# Patient Record
Sex: Female | Born: 1985 | Race: Black or African American | Hispanic: Refuse to answer | Marital: Single | Smoking: Never smoker
Health system: Southern US, Community
[De-identification: ages and names within clinical notes are randomized; demographics above are authoritative.]

## PROBLEM LIST (undated history)

## (undated) DIAGNOSIS — R251 Tremor, unspecified: Secondary | ICD-10-CM

## (undated) DIAGNOSIS — U071 COVID-19: Secondary | ICD-10-CM

## (undated) HISTORY — DX: Tremor, unspecified: R25.1

---

## 2005-05-21 HISTORY — PX: KNEE ARTHROSCOPY W/ ACL RECONSTRUCTION: SHX1858

## 2015-09-12 ENCOUNTER — Encounter: Payer: Self-pay | Admitting: Family Medicine

## 2015-09-12 ENCOUNTER — Ambulatory Visit (INDEPENDENT_AMBULATORY_CARE_PROVIDER_SITE_OTHER): Payer: BLUE CROSS/BLUE SHIELD | Admitting: Family Medicine

## 2015-09-12 VITALS — BP 107/56 | HR 86 | Temp 97.7°F | Resp 14

## 2015-09-12 DIAGNOSIS — J029 Acute pharyngitis, unspecified: Secondary | ICD-10-CM

## 2015-09-12 MED ORDER — AZITHROMYCIN 250 MG PO TABS: ORAL_TABLET | ORAL | Status: DC

## 2015-09-12 NOTE — Addendum Note (Signed)
Addended by: Madlyn Frankel on: 09/12/2015 06:24 PM   Modules accepted: Orders

## 2015-09-12 NOTE — Progress Notes (Signed)
Patient ID: Jennifer Hanna, female   DOB: 02-12-1986, 29 y.o.   MRN: [id number]  Patient presents today with symptoms of sore throat for about 2 weeks. Patient denies any other symptoms. She is taken ibuprofen once for her symptoms. She denies any postnasal drip but does state that the sore throat is worse in the evening. She denies any fever, headache, nausea, vomiting, diarrhea, abdominal pain. She denies any extreme fatigue or weight loss.  ROS: Negative except mentioned above. Vitals as per Epic.  GENERAL: NAD HEENT: mild pharyngeal erythema, no exudate, no erythema of TMs, no cervical LAD RESP: CTA B CARD: RRR NEURO: CN II-XII grossly intact   A/P: Pharyngitis- Rapid Strep test was negative. Given the length of symptoms Will try Z-Pak, ibuprofen when necessary, Claritin when necessary. If any symptoms persist or worsen she will follow up with me or her primary care physician.

## 2017-01-10 ENCOUNTER — Other Ambulatory Visit: Payer: Self-pay | Admitting: Family Medicine

## 2017-01-10 ENCOUNTER — Ambulatory Visit
Admission: RE | Admit: 2017-01-10 | Discharge: 2017-01-10 | Disposition: A | Discharge status: Home / Self Care | Payer: BLUE CROSS/BLUE SHIELD | Source: Ambulatory Visit | Attending: Family Medicine | Admitting: Family Medicine

## 2017-01-10 ENCOUNTER — Ambulatory Visit (INDEPENDENT_AMBULATORY_CARE_PROVIDER_SITE_OTHER): Payer: BLUE CROSS/BLUE SHIELD | Admitting: Family Medicine

## 2017-01-10 DIAGNOSIS — R079 Chest pain, unspecified: Secondary | ICD-10-CM | POA: Diagnosis not present

## 2017-01-10 DIAGNOSIS — R0789 Other chest pain: Secondary | ICD-10-CM | POA: Diagnosis not present

## 2017-01-10 DIAGNOSIS — M940 Chondrocostal junction syndrome [Tietze]: Secondary | ICD-10-CM | POA: Diagnosis not present

## 2017-01-17 NOTE — Progress Notes (Signed)
Patient presents today with substernal chest discomfort. Patient states that she's had this discomfort for the past month. She states that it happens mostly when she is lying down. She denies any reflux symptoms however does eat late at night before going to bed. She denies any shortness of breath or headache or diaphoresis associated. She denies any alcohol or illicit drug use. She denies any history of smoking. She denies any significant past medical history such as PE. Denies any family history of sudden cardiac death. She does admit that she had a upper respiratory infection a few weeks ago in which she had a pretty bad cough. She denies any fevers or chills or weight loss. Exercise does not make her symptoms worse.  ROS: Negative except mentioned above. Vitals as per Epic. GENERAL: NAD HEENT: no pharyngeal erythema, no exudate, no erythema of TMs, no cervical LAD RESP: CTA B, mild tenderness to palpation along left sternal border CARD: RRR NEURO: CN II-XII grossly intact   A/P: Costochondritis - discussed with patient that this is likely what her symptoms are from however will get chest x-ray today, EKG appears normal with PACs, encourage patient to try taking ibuprofen or Aleve with food, encourage patient to avoid eating late at night before going to bed. Would like patient to follow up with me in one week to see how her symptoms are doing. Seek medical attention if any acute problems. Patient addresses understanding of plan.

## 2017-05-01 ENCOUNTER — Encounter: Payer: Self-pay | Admitting: Obstetrics and Gynecology

## 2017-05-03 ENCOUNTER — Ambulatory Visit: Payer: BLUE CROSS/BLUE SHIELD | Admitting: Certified Nurse Midwife

## 2017-05-03 ENCOUNTER — Encounter: Payer: Self-pay | Admitting: Certified Nurse Midwife

## 2017-05-03 VITALS — BP 89/55 | HR 58 | Ht 68.0 in | Wt 145.5 lb

## 2017-05-03 DIAGNOSIS — Z130 Encounter for screening for diseases of the blood and blood-forming organs and certain disorders involving the immune mechanism: Secondary | ICD-10-CM

## 2017-05-03 DIAGNOSIS — Z124 Encounter for screening for malignant neoplasm of cervix: Secondary | ICD-10-CM | POA: Diagnosis not present

## 2017-05-03 DIAGNOSIS — Z13220 Encounter for screening for lipoid disorders: Secondary | ICD-10-CM

## 2017-05-03 DIAGNOSIS — Z01419 Encounter for gynecological examination (general) (routine) without abnormal findings: Secondary | ICD-10-CM

## 2017-05-03 DIAGNOSIS — Z6822 Body mass index (BMI) 22.0-22.9, adult: Secondary | ICD-10-CM | POA: Diagnosis not present

## 2017-05-03 NOTE — Progress Notes (Signed)
ANNUAL PREVENTATIVE CARE GYN  ENCOUNTER NOTE  Subjective:       Barbera Perritt is a 31 y.o. G0P0000 female here for a routine annual gynecologic exam. No current complaints.   Denies difficulty breathing or respiratory distress, chest pain, abdominal pain, excessive vaginal bleeding, dysuria, and leg pain or swelling.     Gynecologic History  Patient's last menstrual period was 04/12/2017.  Period Cycle (Days): 28 Period Duration (Days): Seven (7) Period Pattern: Regular Menstrual Flow: Light Dysmenorrhea: None  Contraception: none  Last Pap: 2016. Results were: normal  Obstetric History  OB History  Gravida Para Term Preterm AB Living  0 0 0 0 0 0  SAB TAB Ectopic Multiple Live Births  0 0 0 0 0        History reviewed. No pertinent past medical history.  Past Surgical History:  Procedure Laterality Date  . KNEE ARTHROSCOPY W/ ACL RECONSTRUCTION Left 2007    Current Outpatient Medications on File Prior to Visit  Medication Sig Dispense Refill  . azithromycin (ZITHROMAX Z-PAK) 250 MG tablet Use as directed for 5 days. (Patient not taking: Reported on 05/03/2017) 6 each 0   No current facility-administered medications on file prior to visit.     No Known Allergies  Social History   Socioeconomic History  . Marital status: Single    Spouse name: Not on file  . Number of children: Not on file  . Years of education: Not on file  . Highest education level: Not on file  Social Needs  . Financial resource strain: Not on file  . Food insecurity - worry: Not on file  . Food insecurity - inability: Not on file  . Transportation needs - medical: Not on file  . Transportation needs - non-medical: Not on file  Occupational History  . Not on file  Tobacco Use  . Smoking status: Never Smoker  . Smokeless tobacco: Never Used  Substance and Sexual Activity  . Alcohol use: Yes  . Drug use: No  . Sexual activity: Yes  Other Topics Concern  . Not on file  Social  History Narrative  . Not on file    Family History  Problem Relation Age of Onset  . Hypertension Father     The following portions of the patient's history were reviewed and updated as appropriate: allergies, current medications, past family history, past medical history, past social history, past surgical history and problem list.  Review of Systems  ROS negative except as noted above. Information obtained from patient.    Objective:   BP (!) 89/55   Pulse (!) 58   Ht 5\' 8"  (1.727 m)   Wt 145 lb 8 oz (66 kg)   LMP 04/12/2017   BMI 22.12 kg/m    CONSTITUTIONAL: Well-developed, well-nourished female in no acute distress.   PSYCHIATRIC: Normal mood and affect. Normal behavior. Normal judgment and thought content.  [place]: Alert and oriented to person, place, and time. Normal muscle tone coordination. No cranial nerve deficit noted.  HENT:  Normocephalic, atraumatic, External right and left ear normal. Oropharynx is clear and moist  EYES: Conjunctivae and EOM are normal. Pupils are equal, round, and reactive to light. No scleral icterus.   NECK: Normal range of motion, supple, no masses.  Normal thyroid.   SKIN: Skin is warm and dry. No rash noted. Not diaphoretic. No erythema. No pallor.  CARDIOVASCULAR: Normal heart rate noted, regular rhythm, no murmur.  RESPIRATORY: Clear to auscultation bilaterally. Effort and breath  sounds normal, no problems with respiration noted.  BREASTS: Symmetric in size. No masses, skin changes, nipple drainage, or lymphadenopathy.  ABDOMEN: Soft, normal bowel sounds, no distention noted.  No tenderness, rebound or guarding.   PELVIC:  External Genitalia: Normal  Vagina: Normal  Cervix: Normal  Uterus: Normal  Adnexa: Normal   MUSCULOSKELETAL: Normal range of motion. No tenderness.  No cyanosis, clubbing, or edema.  2+ distal pulses.  LYMPHATIC: No Axillary, Supraclavicular, or Inguinal Adenopathy.  Assessment:   Annual  gynecologic examination 31 y.o.   Contraception: none   Normal BMI   Problem List Items Addressed This Visit    None    Visit Diagnoses    Well woman exam    -  Primary   Relevant Orders   CBC   Comprehensive metabolic panel   Lipid panel   PapIG, HPV, rfx 16/18   BMI 22.0-22.9, adult       Screening for lipid disorders       Screening, anemia, deficiency, iron       Relevant Orders   CBC   Lipid panel   Screening for cervical cancer       Relevant Orders   PapIG, HPV, rfx 16/18      Plan:   Pap: Pap Co Test  Labs: CBC, CMP, Lipid Panel; see orders.   Routine preventative health maintenance measures emphasized: Exercise/Diet/Weight control, Tobacco Warnings, Alcohol/Substance use risks, Stress Management, Peer Pressure Issues and Safe Sex  Reviewed red flag symptoms and when to call.   RTC x 1 year for annual exam or sooner if needed.    Diona Fanti, CNM [facility], [facility]

## 2017-05-03 NOTE — Patient Instructions (Signed)
Preventive Care 18-39 Years, Female Preventive care refers to lifestyle choices and visits with your health care provider that can promote health and wellness. What does preventive care include?  A yearly physical exam. This is also called an annual well check.  Dental exams once or twice a year.  Routine eye exams. Ask your health care provider how often you should have your eyes checked.  Personal lifestyle choices, including: ? Daily care of your teeth and gums. ? Regular physical activity. ? Eating a healthy diet. ? Avoiding tobacco and drug use. ? Limiting alcohol use. ? Practicing safe sex. ? Taking vitamin and mineral supplements as recommended by your health care provider. What happens during an annual well check? The services and screenings done by your health care provider during your annual well check will depend on your age, overall health, lifestyle risk factors, and family history of disease. Counseling Your health care provider may ask you questions about your:  Alcohol use.  Tobacco use.  Drug use.  Emotional well-being.  Home and relationship well-being.  Sexual activity.  Eating habits.  Work and work Statistician.  Method of birth control.  Menstrual cycle.  Pregnancy history.  Screening You may have the following tests or measurements:  Height, weight, and BMI.  Diabetes screening. This is done by checking your blood sugar (glucose) after you have not eaten for a while (fasting).  Blood pressure.  Lipid and cholesterol levels. These may be checked every 5 years starting at age 66.  Skin check.  Hepatitis C blood test.  Hepatitis B blood test.  Sexually transmitted disease (STD) testing.  BRCA-related cancer screening. This may be done if you have a family history of breast, ovarian, tubal, or peritoneal cancers.  Pelvic exam and Pap test. This may be done every 3 years starting at age 40. Starting at age 59, this may be done every 5  years if you have a Pap test in combination with an HPV test.  Discuss your test results, treatment options, and if necessary, the need for more tests with your health care provider. Vaccines Your health care provider may recommend certain vaccines, such as:  Influenza vaccine. This is recommended every year.  Tetanus, diphtheria, and acellular pertussis (Tdap, Td) vaccine. You may need a Td booster every 10 years.  Varicella vaccine. You may need this if you have not been vaccinated.  HPV vaccine. If you are 69 or younger, you may need three doses over 6 months.  Measles, mumps, and rubella (MMR) vaccine. You may need at least one dose of MMR. You may also need a second dose.  Pneumococcal 13-valent conjugate (PCV13) vaccine. You may need this if you have certain conditions and were not previously vaccinated.  Pneumococcal polysaccharide (PPSV23) vaccine. You may need one or two doses if you smoke cigarettes or if you have certain conditions.  Meningococcal vaccine. One dose is recommended if you are age 27-21 years and a first-year college student living in a residence hall, or if you have one of several medical conditions. You may also need additional booster doses.  Hepatitis A vaccine. You may need this if you have certain conditions or if you travel or work in places where you may be exposed to hepatitis A.  Hepatitis B vaccine. You may need this if you have certain conditions or if you travel or work in places where you may be exposed to hepatitis B.  Haemophilus influenzae type b (Hib) vaccine. You may need this if  you have certain risk factors.  Talk to your health care provider about which screenings and vaccines you need and how often you need them. This information is not intended to replace advice given to you by your health care provider. Make sure you discuss any questions you have with your health care provider. Document Released: 07/03/2001 Document Revised: 01/25/2016  Document Reviewed: 03/08/2015 Elsevier Interactive Patient Education  2017 [employer].

## 2017-05-04 LAB — CBC
Hematocrit: 41.6 % (ref 34.0–46.6)
Hemoglobin: 13.9 g/dL (ref 11.1–15.9)
MCH: 31.7 pg (ref 26.6–33.0)
MCHC: 33.4 g/dL (ref 31.5–35.7)
MCV: 95 fL (ref 79–97)
Platelets: 240 10*3/uL (ref 150–379)
RBC: 4.39 x10E6/uL (ref 3.77–5.28)
RDW: 13.6 % (ref 12.3–15.4)
WBC: 3.4 10*3/uL (ref 3.4–10.8)

## 2017-05-04 LAB — COMPREHENSIVE METABOLIC PANEL
A/G RATIO: 1.5 (ref 1.2–2.2)
ALK PHOS: 44 IU/L (ref 39–117)
ALT: 9 IU/L (ref 0–32)
AST: 18 IU/L (ref 0–40)
Albumin: 4.6 g/dL (ref 3.5–5.5)
BILIRUBIN TOTAL: 0.5 mg/dL (ref 0.0–1.2)
BUN / CREAT RATIO: 14 (ref 9–23)
BUN: 14 mg/dL (ref 6–20)
CHLORIDE: 102 mmol/L (ref 96–106)
CO2: 25 mmol/L (ref 20–29)
Calcium: 9.3 mg/dL (ref 8.7–10.2)
Creatinine, Ser: 1.03 mg/dL — ABNORMAL HIGH (ref 0.57–1.00)
GFR calc Af Amer: 83 mL/min/{1.73_m2} (ref >59)
GFR calc non Af Amer: 72 mL/min/{1.73_m2} (ref >59)
GLOBULIN, TOTAL: 3.1 g/dL (ref 1.5–4.5)
GLUCOSE: 85 mg/dL (ref 65–99)
POTASSIUM: 4.9 mmol/L (ref 3.5–5.2)
SODIUM: 139 mmol/L (ref 134–144)
Total Protein: 7.7 g/dL (ref 6.0–8.5)

## 2017-05-04 LAB — LIPID PANEL
CHOLESTEROL TOTAL: 190 mg/dL (ref 100–199)
Chol/HDL Ratio: 2.9 ratio (ref 0.0–4.4)
HDL: 65 mg/dL (ref >39)
LDL Calculated: 117 mg/dL — ABNORMAL HIGH (ref 0–99)
Triglycerides: 41 mg/dL (ref 0–149)
VLDL Cholesterol Cal: 8 mg/dL (ref 5–40)

## 2017-05-05 LAB — PAPIG, HPV, RFX 16/18
HPV, high-risk: NEGATIVE
PAP Smear Comment: 0

## 2017-05-07 NOTE — Progress Notes (Signed)
Please contact pt with results. Pap normal. CBC normal. Her serum creatinine was slightly elevated, but this could be due to dehydration, high meat intake, or use of supplements. We can recheck again in six (6) months to a year. LDL was slightly elevated as well, but risk is well below average. Encourage MyChart activation. Thanks, JML

## 2017-08-29 ENCOUNTER — Encounter: Payer: Self-pay | Admitting: Family Medicine

## 2017-08-29 ENCOUNTER — Ambulatory Visit (INDEPENDENT_AMBULATORY_CARE_PROVIDER_SITE_OTHER): Payer: BLUE CROSS/BLUE SHIELD | Admitting: Family Medicine

## 2017-08-29 VITALS — BP 104/72 | HR 88 | Temp 100.2°F | Resp 14

## 2017-08-29 DIAGNOSIS — R509 Fever, unspecified: Secondary | ICD-10-CM

## 2017-08-29 DIAGNOSIS — R05 Cough: Secondary | ICD-10-CM

## 2017-08-29 DIAGNOSIS — J029 Acute pharyngitis, unspecified: Secondary | ICD-10-CM

## 2017-08-29 DIAGNOSIS — R059 Cough, unspecified: Secondary | ICD-10-CM

## 2017-08-29 LAB — POCT RAPID STREP A (OFFICE): RAPID STREP A SCREEN: NEGATIVE

## 2017-08-29 LAB — POCT INFLUENZA A/B
INFLUENZA A, POC: NEGATIVE
INFLUENZA B, POC: NEGATIVE

## 2017-08-29 MED ORDER — OSELTAMIVIR PHOSPHATE 75 MG PO CAPS: 75.0000 mg | ORAL_CAPSULE | Freq: Two times a day (BID) | ORAL | 0 refills | Status: DC

## 2017-08-29 NOTE — Progress Notes (Signed)
Patient presents today with symptoms of myalgias, chills, sore throat, nasal drainage, subjective fever, minimal cough. Patient symptoms started yesterday. She denies any chest pain, shortness of breath, nausea, vomiting, diarrhea, urinary symptoms, abdominal pain. Her menstrual cycle just recently ended.She is unsure of any sick contacts. She did not get her flu vaccine this year.  ROS: Negative except mentioned above. Vitals as per Epic. GENERAL: NAD HEENT: moderate pharyngeal erythema, no exudate, no erythema of TMs, shotty cervical LAD RESP: CTA B CARD: RRR NEURO: CN II-XII grossly intact   A/P: Viral Illness - rapid strep test negative, influenza test negative, symptoms however suggestive of influenza, discussed use of Tamiflu patient would like to start, treat with Tylenol/Ibuprofen as needed,cough suppressant as needed, oral antihistamine as needed, rest, hydration, no physical activity until fever has resolved for at least 24 hours, will send throat culture to lab, seek medical attention if symptoms persist or worsen.

## 2017-08-31 LAB — CULTURE, GROUP A STREP: Strep A Culture: NEGATIVE

## 2017-09-03 ENCOUNTER — Ambulatory Visit (INDEPENDENT_AMBULATORY_CARE_PROVIDER_SITE_OTHER): Payer: BLUE CROSS/BLUE SHIELD | Admitting: Family Medicine

## 2017-09-03 ENCOUNTER — Ambulatory Visit
Admission: RE | Admit: 2017-09-03 | Discharge: 2017-09-03 | Disposition: A | Discharge status: Home / Self Care | Payer: BLUE CROSS/BLUE SHIELD | Source: Ambulatory Visit | Attending: Family Medicine | Admitting: Family Medicine

## 2017-09-03 ENCOUNTER — Encounter: Payer: Self-pay | Admitting: Family Medicine

## 2017-09-03 VITALS — BP 113/78 | HR 64 | Temp 97.7°F | Resp 14

## 2017-09-03 DIAGNOSIS — R1084 Generalized abdominal pain: Secondary | ICD-10-CM | POA: Diagnosis not present

## 2017-09-03 DIAGNOSIS — R197 Diarrhea, unspecified: Secondary | ICD-10-CM

## 2017-09-03 LAB — POCT URINE PREGNANCY: Preg Test, Ur: NEGATIVE

## 2017-09-03 MED ORDER — DICYCLOMINE HCL 20 MG PO TABS: 20.0000 mg | ORAL_TABLET | Freq: Four times a day (QID) | ORAL | 0 refills | Status: DC

## 2017-09-03 NOTE — Progress Notes (Signed)
Patient presents today with symptoms of abdominal cramping, vomiting, diarrhea. Patient states that her symptoms started on 4/16. Patient was seen last week for flu-like symptoms and was prescribed Tamiflu. Patient states that she took 4 doses of the Tamiflu. She felt like she was improving. Her URI symptoms are much improved. She denies any fever, severe headache, myalgias, chest pain, shortness of breath, urinary symptoms. She denies any irregular menstrual cycles recently. She has been tolerating fluids.she feels at times that she has gas but cannot expel it. She denies any blood in her stool or black tarry stool. Denies nausea at this time.  ROS: Negative except mentioned above.  GENERAL: NAD HEENT: no pharyngeal erythema, no exudate, no erythema of TMs, no cervical LAD RESP: CTA B CARD: RRR ABD: positive bowel sounds, generalized abdominal tenderness, no rebound or guarding, no flank tenderness NEURO: CN II-XII grossly intact   Urine dip - trace leukocytes, negative blood, negative nitrites, specific gravity 1.025, 1+ protein, 15 ketones, pH 5.0, negative glucose  Urine pregnancy - negative  A/P: Generalized Abdominal Pain, Vomiting/Diarrhea - discussed with patient that could be a result of the virus or the Tamiflu, will do Abdominal X-ray and prescribe Bentyl as anti-spasmodic, rest, hydration, BRAT diet, Tylenol when necessary, seek medical attention if symptoms persist or worsen as discussed.

## 2017-09-04 DIAGNOSIS — L814 Other melanin hyperpigmentation: Secondary | ICD-10-CM | POA: Diagnosis not present

## 2017-09-04 DIAGNOSIS — B360 Pityriasis versicolor: Secondary | ICD-10-CM | POA: Diagnosis not present

## 2017-09-04 DIAGNOSIS — L812 Freckles: Secondary | ICD-10-CM | POA: Diagnosis not present

## 2017-09-04 DIAGNOSIS — L700 Acne vulgaris: Secondary | ICD-10-CM | POA: Diagnosis not present

## 2018-01-06 ENCOUNTER — Ambulatory Visit: Payer: BLUE CROSS/BLUE SHIELD | Admitting: Certified Nurse Midwife

## 2018-01-06 ENCOUNTER — Encounter: Payer: Self-pay | Admitting: Certified Nurse Midwife

## 2018-01-06 VITALS — BP 119/71 | HR 77 | Ht 68.0 in | Wt 145.4 lb

## 2018-01-06 DIAGNOSIS — Z803 Family history of malignant neoplasm of breast: Secondary | ICD-10-CM | POA: Diagnosis not present

## 2018-01-06 DIAGNOSIS — N644 Mastodynia: Secondary | ICD-10-CM

## 2018-01-06 DIAGNOSIS — N6320 Unspecified lump in the left breast, unspecified quadrant: Secondary | ICD-10-CM

## 2018-01-06 DIAGNOSIS — N6325 Unspecified lump in the left breast, overlapping quadrants: Secondary | ICD-10-CM

## 2018-01-06 MED ORDER — EVENING PRIMROSE OIL-VIT E 45-360-15 MG-MG-UNIT PO CAPS: 1.0000 | ORAL_CAPSULE | Freq: Three times a day (TID) | ORAL | 0 refills | Status: DC

## 2018-01-06 NOTE — Progress Notes (Signed)
Pt stated that she has a lump on her left breast that is painful. Pt stated that she noticed the lump 2-3 months ago.  No other complaints.

## 2018-01-06 NOTE — Progress Notes (Signed)
GYN ENCOUNTER NOTE  Subjective:       Jennifer Hanna is a 32 y.o. G0P0000 female is here for gynecologic evaluation of the following issues:  1. Left breast lump 2. Breast pain  Reports intermittent left breast pain and tenderness accompanied by lump. Noticed approximately two (2) to three (3) months ago. Drinks water. Endorses low caffeine and salt intake. Pain increased around menses.   Denies difficulty breathing or respiratory distress, chest pain, nipple pain or discharge, abdominal pain, excessive vaginal bleeding, dysuria, and leg pain or swelling.   Mother was recently diagnosed with breast cancer, but is doing well.    Gynecologic History  Patient's last menstrual period was 12/13/2017.  Contraception: none  Last Pap: 04/2017. Results were: Negative/Negative  Obstetric History  OB History  Gravida Para Term Preterm AB Living  0 0 0 0 0 0  SAB TAB Ectopic Multiple Live Births  0 0 0 0 0    History reviewed. No pertinent past medical history.  Past Surgical History:  Procedure Laterality Date  . KNEE ARTHROSCOPY W/ ACL RECONSTRUCTION Left 2007   No Known Allergies  Social History   Socioeconomic History  . Marital status: Single    Spouse name: Not on file  . Number of children: Not on file  . Years of education: Not on file  . Highest education level: Not on file  Occupational History  . Not on file  Social Needs  . Financial resource strain: Not on file  . Food insecurity:    Worry: Not on file    Inability: Not on file  . Transportation needs:    Medical: Not on file    Non-medical: Not on file  Tobacco Use  . Smoking status: Never Smoker  . Smokeless tobacco: Never Used  Substance and Sexual Activity  . Alcohol use: Yes    Comment: occass/social  . Drug use: No  . Sexual activity: Yes    Birth control/protection: None, Condom  Lifestyle  . Physical activity:    Days per week: Not on file    Minutes per session: Not on file  . Stress: Not  on file  Relationships  . Social connections:    Talks on phone: Not on file    Gets together: Not on file    Attends religious service: Not on file    Active member of club or organization: Not on file    Attends meetings of clubs or organizations: Not on file    Relationship status: Not on file  . Intimate partner violence:    Fear of current or ex partner: Not on file    Emotionally abused: Not on file    Physically abused: Not on file    Forced sexual activity: Not on file  Other Topics Concern  . Not on file  Social History Narrative  . Not on file    Family History  Problem Relation Age of Onset  . Hypertension Father   . Cancer Mother     The following portions of the patient's history were reviewed and updated as appropriate: allergies, current medications, past family history, past medical history, past social history, past surgical history and problem list.  Review of Systems  ROS negative except as noted above. Information obtained from patient.   Objective:   BP 119/71   Pulse 77   Ht 5\' 8"  (1.727 m)   Wt 145 lb 6.4 oz (66 kg)   LMP 12/13/2017   BMI 22.11 kg/m  CONSTITUTIONAL: Well-developed, well-nourished female in no acute distress.   BREASTS: Right breast normal without mass, skin or nipple changes or axillary nodes. Left breast tender to touch with pea sized mobile round mass at 6 o'clock, no skin or nipple changes.   Assessment:   1. Family history of breast cancer in mother  - MM DIGITAL SCREENING BILATERAL; Future  2. Breast pain in female   3. Breast lump on left side at 6 o'clock position  - MM DIGITAL SCREENING BILATERAL; Future  Plan:   Discussed home treatment measures.   Rx: Evening Primrose Oil, see orders.   Mammogram ordered.   Reviewed red flag symptoms and when to call.   RTC as previously scheduled or sooner if needed.    Diona Fanti, CNM [facility], [facility]

## 2018-01-06 NOTE — Patient Instructions (Signed)

## 2018-01-07 ENCOUNTER — Telehealth: Payer: Self-pay | Admitting: Certified Nurse Midwife

## 2018-01-07 ENCOUNTER — Other Ambulatory Visit: Payer: Self-pay | Admitting: Certified Nurse Midwife

## 2018-01-07 ENCOUNTER — Telehealth: Payer: Self-pay

## 2018-01-07 DIAGNOSIS — Z803 Family history of malignant neoplasm of breast: Secondary | ICD-10-CM

## 2018-01-07 DIAGNOSIS — N6325 Unspecified lump in the left breast, overlapping quadrants: Secondary | ICD-10-CM

## 2018-01-07 DIAGNOSIS — N6320 Unspecified lump in the left breast, unspecified quadrant: Secondary | ICD-10-CM

## 2018-01-07 NOTE — Telephone Encounter (Signed)
The patient called and wanted to know if her diagnosis was put in so she is able to schedule her mammogram. Please advise.

## 2018-01-07 NOTE — Telephone Encounter (Signed)
Message left on voicemail- yes diagnosis is attached to order. Encouraged to let us know if there are any other questions or concerns.

## 2018-01-08 ENCOUNTER — Telehealth: Payer: Self-pay | Admitting: Certified Nurse Midwife

## 2018-01-08 NOTE — Telephone Encounter (Signed)
The patient called again in regard to her needing orders put in to schedule her mammogram, and also some paperwork. Please advise.

## 2018-01-09 ENCOUNTER — Telehealth: Payer: Self-pay

## 2018-01-09 NOTE — Telephone Encounter (Signed)
Order is in. Please check to make sure is correct. Thanks, JML

## 2018-01-09 NOTE — Telephone Encounter (Signed)
Message left on pts voicemail- orders have been placed. Not sure what the issue is. Encouraged pt to contact the office if there is anything else we can help with.

## 2018-01-14 ENCOUNTER — Ambulatory Visit
Admission: RE | Admit: 2018-01-14 | Discharge: 2018-01-14 | Disposition: A | Discharge status: Home / Self Care | Payer: BLUE CROSS/BLUE SHIELD | Source: Ambulatory Visit | Attending: Certified Nurse Midwife | Admitting: Certified Nurse Midwife

## 2018-01-14 DIAGNOSIS — Z803 Family history of malignant neoplasm of breast: Secondary | ICD-10-CM | POA: Insufficient documentation

## 2018-01-14 DIAGNOSIS — N6325 Unspecified lump in the left breast, overlapping quadrants: Secondary | ICD-10-CM

## 2018-01-14 DIAGNOSIS — N6320 Unspecified lump in the left breast, unspecified quadrant: Secondary | ICD-10-CM

## 2018-01-14 DIAGNOSIS — N6489 Other specified disorders of breast: Secondary | ICD-10-CM | POA: Diagnosis not present

## 2018-01-14 DIAGNOSIS — R922 Inconclusive mammogram: Secondary | ICD-10-CM | POA: Diagnosis not present

## 2018-04-01 ENCOUNTER — Encounter: Payer: Self-pay | Admitting: Family Medicine

## 2018-04-01 ENCOUNTER — Ambulatory Visit (INDEPENDENT_AMBULATORY_CARE_PROVIDER_SITE_OTHER): Payer: BLUE CROSS/BLUE SHIELD | Admitting: Family Medicine

## 2018-04-01 VITALS — BP 112/94 | HR 63 | Temp 98.2°F | Resp 14

## 2018-04-01 DIAGNOSIS — R251 Tremor, unspecified: Secondary | ICD-10-CM | POA: Diagnosis not present

## 2018-04-01 DIAGNOSIS — R229 Localized swelling, mass and lump, unspecified: Secondary | ICD-10-CM | POA: Diagnosis not present

## 2018-04-01 DIAGNOSIS — R222 Localized swelling, mass and lump, trunk: Secondary | ICD-10-CM

## 2018-04-01 NOTE — Progress Notes (Signed)
Patient presents today with symptoms of bilateral hand numbness and pain at times.  She also has noticed a tremor with both hands over the last year.  She has not seen a physician regarding this.  She denies any fever, headache, joint swelling or discoloration, chest pain, shortness of breath, nausea, vomiting, weight loss, weight gain.  He denies taking any new medications. Patient also has had a palpable tender lesion on the left lower breast/chest wall area for awhile.  She states that it has become more tender due to her bra strap.  She has had an ultrasound and mammogram of the area.  She denies any other breast symptoms.  There is a family history of breast cancer.  ROS: Negative except mentioned above. Vitals as per Epic. GENERAL: NAD HEENT: no pharyngeal erythema, no exudate RESP: CTA B, small mobile pea-sized tender mass noted in the left lower breast/chest wall area CARD: RRR MSK: No obvious joint swelling or erythema, full range of motion of upper and lower extremities, N/V intact NEURO: CN II-XII grossly intact   A/P: 1) Left Lower Breast/Chest Wall Mass: will refer patient to general surgeon as area is palpable, not identified on ultrasound and patient wants to have area removed.  2) Numbness Hands, Tremors: recommend further evaluation by neurology, referral has been made, seek medical attention if any acute worsening symptoms.

## 2018-04-03 ENCOUNTER — Ambulatory Visit (INDEPENDENT_AMBULATORY_CARE_PROVIDER_SITE_OTHER): Payer: BLUE CROSS/BLUE SHIELD | Admitting: General Surgery

## 2018-04-03 ENCOUNTER — Encounter: Payer: Self-pay | Admitting: General Surgery

## 2018-04-03 ENCOUNTER — Other Ambulatory Visit: Payer: Self-pay

## 2018-04-03 VITALS — BP 104/72 | HR 61 | Temp 98.2°F | Ht 68.0 in | Wt 142.0 lb

## 2018-04-03 DIAGNOSIS — R0789 Other chest pain: Secondary | ICD-10-CM

## 2018-04-03 NOTE — Patient Instructions (Addendum)
Please call our office if you have questions or concerns.   

## 2018-04-03 NOTE — Progress Notes (Signed)
Patient ID: Jennifer Hanna, female   DOB: May 23, 1985, 32 y.o.   MRN: [id number]  Chief Complaint  Patient presents with  . New Patient (Initial Visit)    left breast mass    HPI Jennifer Hanna is a 32 y.o. female.   She has been referred for further evaluation of what is described as a left breast mass. She states that it has been present for about a year and is tender to deep palpation. It can be more tender around the time of her menstrual cycle. Sometimes it swells up and is more obvious. There has never been any drainage at the site.  She denies nipple drainage or skin changes. Menarche at age 20. Prior OCP use, but none currently. No pregnancies/breast feeding. She reports performing monthly self breast exams.  She was seen by her OB-GYN provider who describes a pea-sized lump at the 6 o'clock position on the left breast in her note. The patient states that she doesn't think the mass is actually in her breast, but it does rub and become irritated by her bra.  She had both a mammogram and breast ultrasound, neither of which identified any mass in the area.  She does have a family history of breast cancer, with her mother diagnosed at age 33.   History reviewed. No pertinent past medical history.  Past Surgical History:  Procedure Laterality Date  . KNEE ARTHROSCOPY W/ ACL RECONSTRUCTION Left 2007    Family History  Problem Relation Age of Onset  . Hypertension Father   . Cancer Mother   . Breast cancer Mother 23    Social History Social History   Tobacco Use  . Smoking status: Never Smoker  . Smokeless tobacco: Never Used  Substance Use Topics  . Alcohol use: Yes    Comment: occass/social  . Drug use: No    No Known Allergies  Current Outpatient Medications  Medication Sig Dispense Refill  . Evening Primrose Oil-Vit E 45-360-15 MG-MG-UNIT CAPS Take 1 capsule by mouth 3 (three) times daily. 120 each 0   No current facility-administered medications for this visit.      Review of Systems Review of Systems  Constitutional: Negative.   HENT: Negative.   Eyes: Negative.   Respiratory: Negative.   Cardiovascular: Negative.   Gastrointestinal: Negative.   Endocrine: Negative.   Genitourinary: Negative.   Musculoskeletal: Negative.   Skin: Negative.   Neurological: Negative.   Hematological: Negative.   Psychiatric/Behavioral: Negative.     Blood pressure 104/72, pulse 61, temperature 98.2 F (36.8 C), temperature source Temporal, height 5\' 8"  (1.727 m), weight 142 lb (64.4 kg), last menstrual period 03/06/2018, SpO2 99 %.  Physical Exam Physical Exam  Constitutional: She is oriented to person, place, and time. She appears well-developed and well-nourished. No distress.  HENT:  Head: Normocephalic and atraumatic.  Mouth/Throat: Oropharynx is clear and moist. No oropharyngeal exudate.  Eyes: Pupils are equal, round, and reactive to light. Conjunctivae are normal. Right eye exhibits no discharge. Left eye exhibits no discharge. No scleral icterus.  Neck: Normal range of motion. Neck supple. No tracheal deviation present. No thyromegaly present.  No supraclavicular or axillary LAD.  Cardiovascular: Normal rate, regular rhythm, normal heart sounds and intact distal pulses.  Pulmonary/Chest: Effort normal and breath sounds normal. She exhibits tenderness.  The patient directs me to the area of concern. No mass is visualized or palpated.  She is quite thin and I feel only rib/chonral cartilage in the area.  Abdominal: Soft. Bowel sounds are normal.  Navel is pierced.  Musculoskeletal: She exhibits no edema, tenderness or deformity.  Lymphadenopathy:    She has no cervical adenopathy.  Neurological: She is alert and oriented to person, place, and time.  Skin: Skin is warm and dry.  Psychiatric: She has a normal mood and affect.  Vitals reviewed.   Data Reviewed CLINICAL DATA:  32 year old patient presents for evaluation of a lump in the  lower inner quadrant of the left breast. She states that the lump is tender and is more prominent in size near her menstrual cycle. Her mother was diagnosed with breast cancer at age 35.  EXAM: DIGITAL DIAGNOSTIC BILATERAL MAMMOGRAM WITH CAD AND TOMO  ULTRASOUND LEFT BREAST  COMPARISON:  None  ACR Breast Density Category d: The breast tissue is extremely dense, which lowers the sensitivity of mammography.  FINDINGS: Metallic skin marker was placed in the region of palpable concern in the lower inner quadrant of the left breast. On a spot tangential view of this region, dense breast parenchyma is seen without a mass.  No suspicious findings are identified in either breast to suggest malignancy.  Mammographic images were processed with CAD.  On physical exam, the patient demonstrates to me the area of concern at approximately 6 o'clock position along the inferior margin of the breast and slightly inferior to the breast. I palpate a rib in the region of patient concern. I do not palpate a discrete breast mass.  Targeted ultrasound is performed of the inferior left breast. In the region of patient concern is a rib, with overlying muscle, a thin band of breast parenchyma and skin. No breast mass, fluid collection, or skin lesion is seen in the region of patient concern. Additional ultrasound of the adjacent tissues of the inferior left breast is also negative.  IMPRESSION: No evidence of malignancy in either breast. Question if the patient's pain could be musculoskeletal in nature. No suspicious findings in the inferior left breast.  RECOMMENDATION: Screening mammogram at age 18 unless there are persistent or intervening clinical concerns. (Code:SM-B-40A)  I have discussed the findings and recommendations with the patient. Results were also provided in writing at the conclusion of the visit. If applicable, a reminder letter will be sent to the patient regarding  the next appointment.  BI-RADS CATEGORY  1: Negative.   Electronically Signed   By: Curlene Dolphin M.D.   On: 01/14/2018 15:52  Assessment    No breast mass is appreciated in the area of concern.  My physical exam findings correlate with the radiologist's impression of a rib.    Plan    No surgical intervention at this time. Her pain may be musculoskeletal in nature, however I'm not certain as to why it is more uncomfortable around her menstrual cycle. Recommend conservative treatment with OTC analgesics and consider a style of bra that does not irritate the area. RTC PRN.       Fredirick Maudlin 04/03/2018, 10:43 AM

## 2018-05-02 ENCOUNTER — Ambulatory Visit
Admission: RE | Admit: 2018-05-02 | Discharge: 2018-05-02 | Disposition: A | Discharge status: Home / Self Care | Payer: BLUE CROSS/BLUE SHIELD | Source: Ambulatory Visit | Attending: Family Medicine | Admitting: Family Medicine

## 2018-05-02 ENCOUNTER — Ambulatory Visit: Payer: BLUE CROSS/BLUE SHIELD | Admitting: Family Medicine

## 2018-05-02 ENCOUNTER — Ambulatory Visit
Admission: RE | Admit: 2018-05-02 | Discharge: 2018-05-02 | Disposition: A | Discharge status: Home / Self Care | Payer: BLUE CROSS/BLUE SHIELD | Attending: Family Medicine | Admitting: Family Medicine

## 2018-05-02 ENCOUNTER — Encounter: Payer: Self-pay | Admitting: Family Medicine

## 2018-05-02 VITALS — BP 108/73 | HR 60 | Temp 97.8°F | Ht 68.0 in | Wt 146.8 lb

## 2018-05-02 DIAGNOSIS — Z23 Encounter for immunization: Secondary | ICD-10-CM | POA: Diagnosis not present

## 2018-05-02 DIAGNOSIS — R911 Solitary pulmonary nodule: Secondary | ICD-10-CM | POA: Diagnosis not present

## 2018-05-02 DIAGNOSIS — Z13220 Encounter for screening for lipoid disorders: Secondary | ICD-10-CM

## 2018-05-02 DIAGNOSIS — R222 Localized swelling, mass and lump, trunk: Secondary | ICD-10-CM

## 2018-05-02 DIAGNOSIS — R0781 Pleurodynia: Secondary | ICD-10-CM | POA: Diagnosis not present

## 2018-05-02 DIAGNOSIS — R0789 Other chest pain: Secondary | ICD-10-CM | POA: Insufficient documentation

## 2018-05-02 DIAGNOSIS — H6980 Other specified disorders of Eustachian tube, unspecified ear: Secondary | ICD-10-CM | POA: Insufficient documentation

## 2018-05-02 DIAGNOSIS — H6983 Other specified disorders of Eustachian tube, bilateral: Secondary | ICD-10-CM | POA: Diagnosis not present

## 2018-05-02 DIAGNOSIS — R251 Tremor, unspecified: Secondary | ICD-10-CM

## 2018-05-02 DIAGNOSIS — Z803 Family history of malignant neoplasm of breast: Secondary | ICD-10-CM

## 2018-05-02 NOTE — Assessment & Plan Note (Signed)
Tender non-mobile nodule of anterior chest wall that seems to over ride a rib She has had this evaluated as a possible breast mass and that was a benign work-up She has not had an x-ray, so we will obtain 1 to ensure that there is no bony deformity in this area Reassured her of the things that have been ruled out

## 2018-05-02 NOTE — Assessment & Plan Note (Signed)
Mother with breast cancer in her mid 40s Patient with unremarkable mammogram and breast ultrasound recently Recommend starting screening mammograms at age 32

## 2018-05-02 NOTE — Progress Notes (Signed)
Patient: Jennifer Hanna, Female    DOB: 1985-07-04, 32 y.o.   MRN: [id number] Visit Date: 05/02/2018  Today's Provider: Lavon Paganini, MD   Chief Complaint  Patient presents with  . Establish Care   Subjective:  I, Tiburcio Pea, CMA, am acting as a scribe for Lavon Paganini, MD.    New Patient:  Jennifer Hanna is a 32 y.o. female who presents today to [place]. Patient was seeing Dr. Posey Pronto at student health at [facility].  She works as a Biochemist, clinical.  She feels well. She reports exercising 3 days per week. She reports she is sleeping well.  She has had long-standing issue of tremors in bilateral hands with exertion.  This especially happens when she is lifting or flexing.  She has an appointment with a neurologist in February.  She denies any weakness or numbness in her extremities or discoordination.  Patient reports intermittent ear pain and congestion with muffled hearing.  Seems to be worse when she is on an airplane.  She wonders if she has wax buildup.  Patient is also concerned about a palpable lump on her left anterior chest wall just underneath her left breast.  She had a breast exam with mammogram and breast ultrasound done at [facility] in 02/2018 for this.  These were unremarkable and it was thought to possibly be musculoskeletal in origin.  She was then referred to general surgery also for diagnosis musculoskeletal did not require intervention.  She was advised to take NSAIDs as needed and avoid underwire bra as this seemed to worsen it.  Patient reports that this is somewhat cyclical and does seem to hurt more at sometimes than others.  She is not sure if this correlates with her menstrual cycle or not. -----------------------------------------------------------------   Review of Systems  Constitutional: Negative.   HENT: Negative.   Eyes: Negative.   Respiratory: Negative.   Cardiovascular: Negative.   Gastrointestinal: Negative.   Endocrine:  Negative.   Genitourinary: Negative.   Musculoskeletal: Negative.   Skin: Positive for color change.  Allergic/Immunologic: Negative.   Neurological: Negative.   Hematological: Negative.   Psychiatric/Behavioral: Negative.     Social History      She  reports that she has never smoked. She has never used smokeless tobacco. She reports current alcohol use. She reports that she does not use drugs.       Social History   Socioeconomic History  . Marital status: Single    Spouse name: Not on file  . Number of children: Not on file  . Years of education: Not on file  . Highest education level: Not on file  Occupational History  . Not on file  Social Needs  . Financial resource strain: Not on file  . Food insecurity:    Worry: Not on file    Inability: Not on file  . Transportation needs:    Medical: Not on file    Non-medical: Not on file  Tobacco Use  . Smoking status: Never Smoker  . Smokeless tobacco: Never Used  Substance and Sexual Activity  . Alcohol use: Yes    Comment: occass/social  . Drug use: No  . Sexual activity: Yes    Birth control/protection: None, Condom  Lifestyle  . Physical activity:    Days per week: Not on file    Minutes per session: Not on file  . Stress: Not on file  Relationships  . Social connections:    Talks on phone: Not  on file    Gets together: Not on file    Attends religious service: Not on file    Active member of club or organization: Not on file    Attends meetings of clubs or organizations: Not on file    Relationship status: Not on file  Other Topics Concern  . Not on file  Social History Narrative  . Not on file    History reviewed. No pertinent past medical history.   Patient Active Problem List   Diagnosis Date Noted  . Family history of breast cancer in mother 01/06/2018  . Breast pain in female 01/06/2018    Past Surgical History:  Procedure Laterality Date  . KNEE ARTHROSCOPY W/ ACL RECONSTRUCTION Left 2007      Family History        Family Status  Relation Name Status  . Father  Alive  . Mother  Alive  . Sister  Alive  . Brother  Alive  . MGM  Deceased  . MGF  Deceased  . PGM  Deceased  . PGF  Deceased        Her family history includes Breast cancer (age of onset: 34) in her mother; Cancer in her mother; Healthy in her brother and sister; Hypertension in her father.      No Known Allergies  No current outpatient medications on file.   Patient Care Team: Virginia Crews, MD as PCP - General (Family Medicine)      Objective:   Vitals: BP 108/73 (BP Location: Right Arm, Patient Position: Sitting, Cuff Size: Normal)   Pulse 60   Temp 97.8 F (36.6 C) (Oral)   Ht 5\' 8"  (1.727 m)   Wt 146 lb 12.8 oz (66.6 kg)   SpO2 99%   BMI 22.32 kg/m    Vitals:   05/02/18 0905  BP: 108/73  Pulse: 60  Temp: 97.8 F (36.6 C)  TempSrc: Oral  SpO2: 99%  Weight: 146 lb 12.8 oz (66.6 kg)  Height: 5\' 8"  (1.727 m)     Physical Exam Vitals signs reviewed.  Constitutional:      General: She is not in acute distress.    Appearance: Normal appearance. She is well-developed and normal weight. She is not diaphoretic.  HENT:     Head: Normocephalic and atraumatic.     Right Ear: Tympanic membrane, ear canal and external ear normal. There is no impacted cerumen.     Left Ear: Tympanic membrane, ear canal and external ear normal. There is no impacted cerumen.     Nose: Nose normal.     Mouth/Throat:     Mouth: Mucous membranes are moist.     Pharynx: Oropharynx is clear. No oropharyngeal exudate.  Eyes:     General: No scleral icterus.    Extraocular Movements: Extraocular movements intact.     Conjunctiva/sclera: Conjunctivae normal.     Pupils: Pupils are equal, round, and reactive to light.  Neck:     Musculoskeletal: Neck supple.     Thyroid: No thyromegaly.  Cardiovascular:     Rate and Rhythm: Normal rate and regular rhythm.     Pulses: Normal pulses.     Heart sounds:  Normal heart sounds. No murmur.  Pulmonary:     Effort: Pulmonary effort is normal. No respiratory distress.     Breath sounds: Normal breath sounds. No wheezing or rales.  Abdominal:     General: Bowel sounds are normal. There is no distension.  Palpations: Abdomen is soft.     Tenderness: There is no abdominal tenderness. There is no guarding or rebound.  Musculoskeletal:        General: No deformity.     Right lower leg: No edema.     Left lower leg: No edema.     Comments: 1 cm nodule palpable over left anterior chest wall just below the left breast.  It is tender and non-mobile.  Seems to override a rib just lateral to midline  Lymphadenopathy:     Cervical: No cervical adenopathy.  Skin:    General: Skin is warm and dry.     Capillary Refill: Capillary refill takes less than 2 seconds.     Findings: No rash.  Neurological:     General: No focal deficit present.     Mental Status: She is alert and oriented to person, place, and time.     Cranial Nerves: No cranial nerve deficit.     Sensory: No sensory deficit.     Comments: Tremor appreciable symmetrically in bilateral arms with muscle flexion  Psychiatric:        Mood and Affect: Mood normal.        Behavior: Behavior normal.        Thought Content: Thought content normal.      Depression Screen PHQ 2/9 Scores 05/02/2018  PHQ - 2 Score 0  PHQ- 9 Score 0     Assessment & Plan:     Establish care  Exercise Activities and Dietary recommendations Goals   None     Immunization History  Administered Date(s) Administered  . Tdap 05/02/2018    Health Maintenance  Topic Date Due  . HIV Screening  06/02/1999  . TETANUS/TDAP  06/02/2003  . INFLUENZA VACCINE  09/18/2018 (Originally 12/19/2017)  . PAP SMEAR-Modifier  05/03/2020     Discussed health benefits of physical activity, and encouraged her to engage in regular exercise appropriate for her age and condition.      --------------------------------------------------------------------  Problem List Items Addressed This Visit      Nervous and Auditory   Eustachian tube dysfunction    Ear exam is benign today Discussed with patient that her symptoms are consistent with eustachian tube dysfunction Advised Flonase as needed, especially when she will be in situations that seem to worsen this, such as on an airplane        Other   Family history of breast cancer in mother    Mother with breast cancer in her mid 65s Patient with unremarkable mammogram and breast ultrasound recently Recommend starting screening mammograms at age 3      Tremor - Primary    Chronic problem Is already been referred to neurology and will have work-up at that point      Relevant Orders   Comprehensive metabolic panel   CBC w/Diff/Platelet   Nodule of anterior chest wall    Tender non-mobile nodule of anterior chest wall that seems to over ride a rib She has had this evaluated as a possible breast mass and that was a benign work-up She has not had an x-ray, so we will obtain 1 to ensure that there is no bony deformity in this area Reassured her of the things that have been ruled out      Relevant Orders   DG Ribs Unilateral Left    Other Visit Diagnoses    Screening for lipid disorders       Relevant Orders   Lipid panel  Comprehensive metabolic panel   Need for diphtheria-tetanus-pertussis (Tdap) vaccine       Relevant Orders   Tdap vaccine greater than or equal to 7yo IM (Completed)       Return in about 1 year (around 05/03/2019) for CPE.   The entirety of the information documented in the History of Present Illness, Review of Systems and Physical Exam were personally obtained by me. Portions of this information were initially documented by Thurston Hole, CMA and reviewed by me for thoroughness and accuracy.    Virginia Crews, MD, MPH [facility] 05/02/2018 9:48 AM

## 2018-05-02 NOTE — Assessment & Plan Note (Signed)
Ear exam is benign today Discussed with patient that her symptoms are consistent with eustachian tube dysfunction Advised Flonase as needed, especially when she will be in situations that seem to worsen this, such as on an airplane

## 2018-05-02 NOTE — Patient Instructions (Signed)
Preventive Care 18-39 Years, Female Preventive care refers to lifestyle choices and visits with your health care provider that can promote health and wellness. What does preventive care include?  A yearly physical exam. This is also called an annual well check.  Dental exams once or twice a year.  Routine eye exams. Ask your health care provider how often you should have your eyes checked.  Personal lifestyle choices, including: ? Daily care of your teeth and gums. ? Regular physical activity. ? Eating a healthy diet. ? Avoiding tobacco and drug use. ? Limiting alcohol use. ? Practicing safe sex. ? Taking vitamin and mineral supplements as recommended by your health care provider. What happens during an annual well check? The services and screenings done by your health care provider during your annual well check will depend on your age, overall health, lifestyle risk factors, and family history of disease. Counseling Your health care provider may ask you questions about your:  Alcohol use.  Tobacco use.  Drug use.  Emotional well-being.  Home and relationship well-being.  Sexual activity.  Eating habits.  Work and work Statistician.  Method of birth control.  Menstrual cycle.  Pregnancy history.  Screening You may have the following tests or measurements:  Height, weight, and BMI.  Diabetes screening. This is done by checking your blood sugar (glucose) after you have not eaten for a while (fasting).  Blood pressure.  Lipid and cholesterol levels. These may be checked every 5 years starting at age 66.  Skin check.  Hepatitis C blood test.  Hepatitis B blood test.  Sexually transmitted disease (STD) testing.  BRCA-related cancer screening. This may be done if you have a family history of breast, ovarian, tubal, or peritoneal cancers.  Pelvic exam and Pap test. This may be done every 3 years starting at age 40. Starting at age 59, this may be done every 5  years if you have a Pap test in combination with an HPV test.  Discuss your test results, treatment options, and if necessary, the need for more tests with your health care provider. Vaccines Your health care provider may recommend certain vaccines, such as:  Influenza vaccine. This is recommended every year.  Tetanus, diphtheria, and acellular pertussis (Tdap, Td) vaccine. You may need a Td booster every 10 years.  Varicella vaccine. You may need this if you have not been vaccinated.  HPV vaccine. If you are 69 or younger, you may need three doses over 6 months.  Measles, mumps, and rubella (MMR) vaccine. You may need at least one dose of MMR. You may also need a second dose.  Pneumococcal 13-valent conjugate (PCV13) vaccine. You may need this if you have certain conditions and were not previously vaccinated.  Pneumococcal polysaccharide (PPSV23) vaccine. You may need one or two doses if you smoke cigarettes or if you have certain conditions.  Meningococcal vaccine. One dose is recommended if you are age 27-21 years and a first-year college student living in a residence hall, or if you have one of several medical conditions. You may also need additional booster doses.  Hepatitis A vaccine. You may need this if you have certain conditions or if you travel or work in places where you may be exposed to hepatitis A.  Hepatitis B vaccine. You may need this if you have certain conditions or if you travel or work in places where you may be exposed to hepatitis B.  Haemophilus influenzae type b (Hib) vaccine. You may need this if  you have certain risk factors.  Talk to your health care provider about which screenings and vaccines you need and how often you need them. This information is not intended to replace advice given to you by your health care provider. Make sure you discuss any questions you have with your health care provider. Document Released: 07/03/2001 Document Revised: 01/25/2016  Document Reviewed: 03/08/2015 Elsevier Interactive Patient Education  [employer].

## 2018-05-02 NOTE — Assessment & Plan Note (Signed)
Chronic problem Is already been referred to neurology and will have work-up at that point

## 2018-05-03 LAB — COMPREHENSIVE METABOLIC PANEL
ALT: 18 IU/L (ref 0–32)
AST: 21 IU/L (ref 0–40)
Albumin/Globulin Ratio: 1.4 (ref 1.2–2.2)
Albumin: 4.1 g/dL (ref 3.5–5.5)
Alkaline Phosphatase: 37 IU/L — ABNORMAL LOW (ref 39–117)
BILIRUBIN TOTAL: 0.7 mg/dL (ref 0.0–1.2)
BUN/Creatinine Ratio: 14 (ref 9–23)
BUN: 13 mg/dL (ref 6–20)
CHLORIDE: 104 mmol/L (ref 96–106)
CO2: 22 mmol/L (ref 20–29)
Calcium: 9.2 mg/dL (ref 8.7–10.2)
Creatinine, Ser: 0.94 mg/dL (ref 0.57–1.00)
GFR calc Af Amer: 92 mL/min/{1.73_m2} (ref >59)
GFR calc non Af Amer: 80 mL/min/{1.73_m2} (ref >59)
Globulin, Total: 2.9 g/dL (ref 1.5–4.5)
Glucose: 80 mg/dL (ref 65–99)
Potassium: 4.1 mmol/L (ref 3.5–5.2)
Sodium: 140 mmol/L (ref 134–144)
Total Protein: 7 g/dL (ref 6.0–8.5)

## 2018-05-03 LAB — CBC WITH DIFFERENTIAL/PLATELET
Basophils Absolute: 0 10*3/uL (ref 0.0–0.2)
Basos: 1 %
EOS (ABSOLUTE): 0 10*3/uL (ref 0.0–0.4)
Eos: 2 %
Hematocrit: 35 % (ref 34.0–46.6)
Hemoglobin: 11.9 g/dL (ref 11.1–15.9)
Immature Grans (Abs): 0 10*3/uL (ref 0.0–0.1)
Immature Granulocytes: 0 %
LYMPHS ABS: 1.3 10*3/uL (ref 0.7–3.1)
Lymphs: 52 %
MCH: 32.3 pg (ref 26.6–33.0)
MCHC: 34 g/dL (ref 31.5–35.7)
MCV: 95 fL (ref 79–97)
Monocytes Absolute: 0.2 10*3/uL (ref 0.1–0.9)
Monocytes: 9 %
Neutrophils Absolute: 0.9 10*3/uL — ABNORMAL LOW (ref 1.4–7.0)
Neutrophils: 36 %
Platelets: 205 10*3/uL (ref 150–450)
RBC: 3.68 x10E6/uL — ABNORMAL LOW (ref 3.77–5.28)
RDW: 12.4 % (ref 12.3–15.4)
WBC: 2.4 10*3/uL — CL (ref 3.4–10.8)

## 2018-05-03 LAB — LIPID PANEL
CHOL/HDL RATIO: 3 ratio (ref 0.0–4.4)
CHOLESTEROL TOTAL: 182 mg/dL (ref 100–199)
HDL: 60 mg/dL (ref >39)
LDL Calculated: 113 mg/dL — ABNORMAL HIGH (ref 0–99)
TRIGLYCERIDES: 43 mg/dL (ref 0–149)
VLDL Cholesterol Cal: 9 mg/dL (ref 5–40)

## 2018-05-05 ENCOUNTER — Telehealth: Payer: Self-pay

## 2018-05-05 NOTE — Telephone Encounter (Signed)
LVMTRC 

## 2018-05-05 NOTE — Telephone Encounter (Signed)
Patient advised as below.  

## 2018-05-05 NOTE — Telephone Encounter (Signed)
-----   Message from Virginia Crews, MD sent at 05/05/2018  9:44 AM EST ----- Normal labs, except WBC is low.  Any recent illness?  Would recommend recheck CBC w Diff in ~2 weeks

## 2018-05-08 ENCOUNTER — Encounter: Payer: BLUE CROSS/BLUE SHIELD | Admitting: Certified Nurse Midwife

## 2018-05-16 ENCOUNTER — Encounter: Payer: BLUE CROSS/BLUE SHIELD | Admitting: Certified Nurse Midwife

## 2018-06-11 ENCOUNTER — Telehealth: Payer: Self-pay | Admitting: Family Medicine

## 2018-06-11 DIAGNOSIS — D72819 Decreased white blood cell count, unspecified: Secondary | ICD-10-CM

## 2018-06-11 NOTE — Telephone Encounter (Signed)
Pt stated she was advised December 2019 she needed to come back and have her labs rechecked since her WBC was low. Pt is requesting lab slip. Please advise. Thanks TNP

## 2018-06-11 NOTE — Telephone Encounter (Signed)
Printed lab requisition for patient and placed up front.

## 2018-06-25 DIAGNOSIS — D72819 Decreased white blood cell count, unspecified: Secondary | ICD-10-CM | POA: Diagnosis not present

## 2018-06-26 LAB — CBC WITH DIFFERENTIAL/PLATELET
Basophils Absolute: 0 10*3/uL (ref 0.0–0.2)
Basos: 1 %
EOS (ABSOLUTE): 0 10*3/uL (ref 0.0–0.4)
Eos: 1 %
Hematocrit: 36.5 % (ref 34.0–46.6)
Hemoglobin: 11.9 g/dL (ref 11.1–15.9)
Immature Grans (Abs): 0 10*3/uL (ref 0.0–0.1)
Immature Granulocytes: 0 %
Lymphocytes Absolute: 1.6 10*3/uL (ref 0.7–3.1)
Lymphs: 52 %
MCH: 30.3 pg (ref 26.6–33.0)
MCHC: 32.6 g/dL (ref 31.5–35.7)
MCV: 93 fL (ref 79–97)
Monocytes Absolute: 0.3 10*3/uL (ref 0.1–0.9)
Monocytes: 9 %
Neutrophils Absolute: 1.2 10*3/uL — ABNORMAL LOW (ref 1.4–7.0)
Neutrophils: 37 %
Platelets: 222 10*3/uL (ref 150–450)
RBC: 3.93 x10E6/uL (ref 3.77–5.28)
RDW: 12.9 % (ref 11.7–15.4)
WBC: 3.1 10*3/uL — ABNORMAL LOW (ref 3.4–10.8)

## 2018-06-30 ENCOUNTER — Telehealth: Payer: Self-pay

## 2018-06-30 DIAGNOSIS — D72819 Decreased white blood cell count, unspecified: Secondary | ICD-10-CM

## 2018-06-30 NOTE — Telephone Encounter (Signed)
Patient was advised and states she will come in to have labs redrawn next week sometime. Patient is aware that she does not have to make appointment just come in and lab slip will be at the front desk.

## 2018-06-30 NOTE — Telephone Encounter (Signed)
Please make sure CBC with Diff slip is left up front

## 2018-06-30 NOTE — Telephone Encounter (Signed)
Lab slip printed at the front desk for pick up.  

## 2018-06-30 NOTE — Telephone Encounter (Signed)
-----   Message from Virginia Crews, MD sent at 06/30/2018  8:08 AM EST ----- Improving, but still low WBCs.  Let's recheck in 2 weeks (labs only)

## 2018-07-04 ENCOUNTER — Ambulatory Visit (INDEPENDENT_AMBULATORY_CARE_PROVIDER_SITE_OTHER): Payer: BLUE CROSS/BLUE SHIELD | Admitting: Family Medicine

## 2018-07-04 VITALS — BP 110/70 | HR 79 | Temp 98.5°F | Resp 14

## 2018-07-04 DIAGNOSIS — D72819 Decreased white blood cell count, unspecified: Secondary | ICD-10-CM | POA: Diagnosis not present

## 2018-07-04 NOTE — Progress Notes (Signed)
Patient presents today to review lab results that were done by her PMD.  Patient is concerned about her white blood cell count being low.  This has been checked 2 or 3 times in the past year. Labs were reviewed in Epic.  Patient denies any significant weight loss in the last year.  She admits to a normal number of upper respiratory infections throughout the year.  She has noticed that at times her mucus is dark brown when she has URIs.  Patient is not a smoker.  Patient was scheduled to see Neurology since she has experienced tremors and joint pain for awhile.  She however missed her appointment because she was on the road with basketball.  She is rescheduled for April.  Patient denies any chest pain, SOB, wheezing, abdominal pain, nausea, vomiting, diarrhea, severe headache, urinary symptoms, vaginal symptoms, dizziness.  ROS: Negative except mentioned above. Vitals as per Epic. GENERAL: NAD HEENT: no pharyngeal erythema, no exudate RESP: CTA B CARD: RRR EXTREM: no swelling or pain, negative Homans NEURO: CN II-XII grossly intact, no tremors at this time noted  A/P: Leukopenia/Neutropenia -would recommend that patient see Hematology/Oncology, patient would like me to make a referral, encouraged patient to inform her PMD that this is being done, seek medical attention if any acute problems.  Any further work-up and imaging can be determined by Hematology/Oncology.  Tremors -encouraged patient to keep appointment with Neurology, will see if an appointment can be made sooner.  Seek medical attention if any acute problems.

## 2018-07-13 DIAGNOSIS — D72819 Decreased white blood cell count, unspecified: Secondary | ICD-10-CM | POA: Insufficient documentation

## 2018-07-13 NOTE — Progress Notes (Signed)
[place]  [phone] [phone]  ID: Jennifer Hanna OB: [birth year]  MR#: [id number]  OEU#:[id number]  Patient Care Team: Virginia Crews, MD as PCP - General (Family Medicine)  CHIEF COMPLAINT: Leukopenia.  INTERVAL HISTORY: Patient is a 33 year old female who was found to have a decreased white blood cell count on routine blood work.  Repeat testing confirmed the results.  She currently feels well and is asymptomatic.  She denies any repeated fevers or illnesses.  She has a good appetite and denies weight loss.  She has no neurologic complaints.  She denies any new medications.  She has no chest pain or shortness of breath.  She denies any nausea, vomiting, constipation, or diarrhea.  She has no urinary complaints.  Patient feels at her baseline and offers no specific complaints today.  REVIEW OF SYSTEMS:   Review of Systems  Constitutional: Negative.  Negative for fever, malaise/fatigue and weight loss.  Respiratory: Negative.  Negative for cough, hemoptysis and shortness of breath.   Cardiovascular: Negative.  Negative for chest pain and leg swelling.  Gastrointestinal: Negative.  Negative for abdominal pain.  Genitourinary: Negative.  Negative for dysuria.  Musculoskeletal: Negative.  Negative for back pain.  Skin: Negative.  Negative for rash.  Neurological: Negative.  Negative for dizziness, focal weakness and weakness.  Psychiatric/Behavioral: Negative.  The patient is not nervous/anxious.     As per HPI. Otherwise, a complete review of systems is negative.  PAST MEDICAL HISTORY: Past Medical History:  Diagnosis Date  . Tremors of nervous system     PAST SURGICAL HISTORY: Past Surgical History:  Procedure Laterality Date  . KNEE ARTHROSCOPY W/ ACL RECONSTRUCTION Left 2007    FAMILY HISTORY: Family History  Problem Relation Age of Onset  . Hypertension Father   . Breast cancer Mother 3  . Hypertension Sister   .  Healthy Brother   . Colon cancer Neg Hx   . Ovarian cancer Neg Hx     ADVANCED DIRECTIVES (Y/N):  N  HEALTH MAINTENANCE: Social History   Tobacco Use  . Smoking status: Never Smoker  . Smokeless tobacco: Never Used  Substance Use Topics  . Alcohol use: Yes    Comment: occass/social  . Drug use: No     Colonoscopy:  PAP:  Bone density:  Lipid panel:  No Known Allergies  Current Outpatient Medications  Medication Sig Dispense Refill  . Multiple Vitamin (MULTIVITAMIN) tablet Take 1 tablet by mouth daily.     No current facility-administered medications for this visit.     OBJECTIVE: Vitals:   07/14/18 1341  BP: 110/64  Pulse: 69  Temp: 97.8 F (36.6 C)     Body mass index is 21.59 kg/m.    ECOG FS:0 - Asymptomatic  General: Well-developed, well-nourished, no acute distress. Eyes: Pink conjunctiva, anicteric sclera. HEENT: Normocephalic, moist mucous membranes, clear oropharnyx. Lungs: Clear to auscultation bilaterally. Heart: Regular rate and rhythm. No rubs, murmurs, or gallops. Abdomen: Soft, nontender, nondistended. No organomegaly noted, normoactive bowel sounds. Musculoskeletal: No edema, cyanosis, or clubbing. Neuro: Alert, answering all questions appropriately. Cranial nerves grossly intact. Skin: No rashes or petechiae noted. Psych: Normal affect. Lymphatics: No cervical, calvicular, axillary or inguinal LAD.   LAB RESULTS:  Lab Results  Component Value Date   NA 140 05/02/2018   K 4.1 05/02/2018   CL 104 05/02/2018   CO2 22 05/02/2018   GLUCOSE 80 05/02/2018   BUN 13 05/02/2018   CREATININE 0.94 05/02/2018  CALCIUM 9.2 05/02/2018   PROT 7.0 05/02/2018   ALBUMIN 4.1 05/02/2018   AST 21 05/02/2018   ALT 18 05/02/2018   ALKPHOS 37 (L) 05/02/2018   BILITOT 0.7 05/02/2018   GFRNONAA 80 05/02/2018   GFRAA 92 05/02/2018    Lab Results  Component Value Date   WBC 3.9 (L) 07/14/2018   NEUTROABS 2.0 07/14/2018   HGB 13.4 07/14/2018   HCT  40.7 07/14/2018   MCV 93.6 07/14/2018   PLT 233 07/14/2018     STUDIES: No results found.  ASSESSMENT: Leukopenia.  PLAN:    1. Leukopenia: Patient continues to have a persistently decreased white blood cell count, but this has improved to 3.9.  She is also asymptomatic.  She has some mild iron deficiency without anemia that may be contributing.  The remainder of her blood work is either negative or within normal limits.  Peripheral blood flow cytometry is pending at time of dictation.  No intervention is needed at this time.  Patient may benefit from oral iron in the future.  Return to clinic in 6 weeks with repeat laboratory work and further evaluation.  If everything remains stable, patient can likely be discharged from clinic. 2.  Iron deficiency: Without anemia.  Consider oral iron supplementation.  I spent a total of 45 minutes face-to-face with the patient of which greater than 50% of the visit was spent in counseling and coordination of care as detailed above.   Patient expressed understanding and was in agreement with this plan. She also understands that She can call clinic at any time with any questions, concerns, or complaints.    Lloyd Huger, MD   07/15/2018 1:26 PM

## 2018-07-14 ENCOUNTER — Inpatient Hospital Stay: Payer: BLUE CROSS/BLUE SHIELD

## 2018-07-14 ENCOUNTER — Inpatient Hospital Stay: Payer: BLUE CROSS/BLUE SHIELD | Attending: Oncology | Admitting: Oncology

## 2018-07-14 ENCOUNTER — Encounter: Payer: Self-pay | Admitting: Oncology

## 2018-07-14 ENCOUNTER — Encounter (INDEPENDENT_AMBULATORY_CARE_PROVIDER_SITE_OTHER): Payer: Self-pay

## 2018-07-14 ENCOUNTER — Other Ambulatory Visit: Payer: Self-pay

## 2018-07-14 VITALS — BP 110/64 | HR 69 | Temp 97.8°F | Ht 68.0 in | Wt 142.0 lb

## 2018-07-14 DIAGNOSIS — R252 Cramp and spasm: Secondary | ICD-10-CM | POA: Diagnosis not present

## 2018-07-14 DIAGNOSIS — M79604 Pain in right leg: Secondary | ICD-10-CM | POA: Diagnosis not present

## 2018-07-14 DIAGNOSIS — D72819 Decreased white blood cell count, unspecified: Secondary | ICD-10-CM

## 2018-07-14 DIAGNOSIS — E611 Iron deficiency: Secondary | ICD-10-CM

## 2018-07-14 DIAGNOSIS — Z803 Family history of malignant neoplasm of breast: Secondary | ICD-10-CM

## 2018-07-14 DIAGNOSIS — R200 Anesthesia of skin: Secondary | ICD-10-CM | POA: Diagnosis not present

## 2018-07-14 DIAGNOSIS — R251 Tremor, unspecified: Secondary | ICD-10-CM | POA: Diagnosis not present

## 2018-07-14 LAB — FOLATE: Folate: 38 ng/mL (ref >5.9)

## 2018-07-14 LAB — CBC WITH DIFFERENTIAL/PLATELET
Abs Immature Granulocytes: 0 10*3/uL (ref 0.00–0.07)
Basophils Absolute: 0 10*3/uL (ref 0.0–0.1)
Basophils Relative: 1 %
Eosinophils Absolute: 0 10*3/uL (ref 0.0–0.5)
Eosinophils Relative: 1 %
HCT: 40.7 % (ref 36.0–46.0)
Hemoglobin: 13.4 g/dL (ref 12.0–15.0)
Immature Granulocytes: 0 %
Lymphocytes Relative: 41 %
Lymphs Abs: 1.6 10*3/uL (ref 0.7–4.0)
MCH: 30.8 pg (ref 26.0–34.0)
MCHC: 32.9 g/dL (ref 30.0–36.0)
MCV: 93.6 fL (ref 80.0–100.0)
Monocytes Absolute: 0.3 10*3/uL (ref 0.1–1.0)
Monocytes Relative: 7 %
NEUTROS ABS: 2 10*3/uL (ref 1.7–7.7)
Neutrophils Relative %: 50 %
Platelets: 233 10*3/uL (ref 150–400)
RBC: 4.35 MIL/uL (ref 3.87–5.11)
RDW: 12.8 % (ref 11.5–15.5)
WBC: 3.9 10*3/uL — ABNORMAL LOW (ref 4.0–10.5)
nRBC: 0 % (ref 0.0–0.2)

## 2018-07-14 LAB — VITAMIN B12: VITAMIN B 12: 747 pg/mL (ref 180–914)

## 2018-07-14 LAB — FERRITIN: Ferritin: 5 ng/mL — ABNORMAL LOW (ref 11–307)

## 2018-07-14 LAB — IRON AND TIBC
Iron: 116 ug/dL (ref 28–170)
Saturation Ratios: 34 % — ABNORMAL HIGH (ref 10.4–31.8)
TIBC: 345 ug/dL (ref 250–450)
UIBC: 229 ug/dL

## 2018-07-14 NOTE — Progress Notes (Signed)
Patient is here today to establish care for her Leukopenia.

## 2018-07-15 ENCOUNTER — Other Ambulatory Visit: Payer: Self-pay | Admitting: Neurology

## 2018-07-15 DIAGNOSIS — R251 Tremor, unspecified: Secondary | ICD-10-CM

## 2018-07-16 LAB — COMP PANEL: LEUKEMIA/LYMPHOMA

## 2018-07-17 ENCOUNTER — Ambulatory Visit (INDEPENDENT_AMBULATORY_CARE_PROVIDER_SITE_OTHER): Payer: BLUE CROSS/BLUE SHIELD | Admitting: Certified Nurse Midwife

## 2018-07-17 ENCOUNTER — Other Ambulatory Visit (HOSPITAL_COMMUNITY)
Admission: RE | Admit: 2018-07-17 | Discharge: 2018-07-17 | Disposition: A | Discharge status: Home / Self Care | Payer: BLUE CROSS/BLUE SHIELD | Source: Ambulatory Visit | Attending: Certified Nurse Midwife | Admitting: Certified Nurse Midwife

## 2018-07-17 VITALS — BP 99/60 | HR 55 | Ht 68.0 in | Wt 141.1 lb

## 2018-07-17 DIAGNOSIS — Z113 Encounter for screening for infections with a predominantly sexual mode of transmission: Secondary | ICD-10-CM | POA: Diagnosis not present

## 2018-07-17 DIAGNOSIS — Z01419 Encounter for gynecological examination (general) (routine) without abnormal findings: Secondary | ICD-10-CM

## 2018-07-17 DIAGNOSIS — N898 Other specified noninflammatory disorders of vagina: Secondary | ICD-10-CM

## 2018-07-17 DIAGNOSIS — L290 Pruritus ani: Secondary | ICD-10-CM

## 2018-07-17 NOTE — Progress Notes (Signed)
ANNUAL PREVENTATIVE CARE GYN  ENCOUNTER NOTE  Subjective:       Jennifer Hanna is a 33 y.o. G0P0000 female here for a routine annual gynecologic exam.  Current complaints: 1. Vaginal itching 2. Anal itching 3. Desires STI screening  Reports intermittent vaginal and anal itching. No relief with home treatment measures.   Denies difficulty breathing or respiratory distress, chest pain, abdominal pain, excessive vaginal bleeding, dysuria, and leg pain or swelling.    Gynecologic History  Patient's last menstrual period was 06/27/2018 (exact date).  Period Cycle (Days): 28 Period Duration (Days): 7 Period Pattern: Regular Menstrual Flow: Moderate Menstrual Control: Maxi pad Menstrual Control Change Freq (Hours): SIx (6) to eight (8) Dysmenorrhea: None  Contraception: condoms  Last Pap: 04/2017. Results were: Negative/Negative  Obstetric History  OB History  Gravida Para Term Preterm AB Living  0 0 0 0 0 0  SAB TAB Ectopic Multiple Live Births  0 0 0 0 0    Past Medical History:  Diagnosis Date  . Tremors of nervous system     Past Surgical History:  Procedure Laterality Date  . KNEE ARTHROSCOPY W/ ACL RECONSTRUCTION Left 2007    Current Outpatient Medications on File Prior to Visit  Medication Sig Dispense Refill  . Multiple Vitamin (MULTIVITAMIN) tablet Take 1 tablet by mouth daily.     No current facility-administered medications on file prior to visit.     No Known Allergies  Social History   Socioeconomic History  . Marital status: Single    Spouse name: Not on file  . Number of children: 0  . Years of education: Not on file  . Highest education level: Not on file  Occupational History  . Occupation: Biochemist, clinical  Social Needs  . Financial resource strain: Not on file  . Food insecurity:    Worry: Not on file    Inability: Not on file  . Transportation needs:    Medical: Not on file    Non-medical: Not on file  Tobacco Use  . Smoking  status: Never Smoker  . Smokeless tobacco: Never Used  Substance and Sexual Activity  . Alcohol use: Yes    Comment: occass/social  . Drug use: No  . Sexual activity: Yes    Partners: Male    Birth control/protection: Condom  Lifestyle  . Physical activity:    Days per week: Not on file    Minutes per session: Not on file  . Stress: Not on file  Relationships  . Social connections:    Talks on phone: Not on file    Gets together: Not on file    Attends religious service: Not on file    Active member of club or organization: Not on file    Attends meetings of clubs or organizations: Not on file    Relationship status: Not on file  . Intimate partner violence:    Fear of current or ex partner: Not on file    Emotionally abused: Not on file    Physically abused: Not on file    Forced sexual activity: Not on file  Other Topics Concern  . Not on file  Social History Narrative  . Not on file    Family History  Problem Relation Age of Onset  . Hypertension Father   . Breast cancer Mother 62  . Hypertension Sister   . Healthy Brother   . Colon cancer Neg Hx   . Ovarian cancer Neg Hx  The following portions of the patient's history were reviewed and updated as appropriate: allergies, current medications, past family history, past medical history, past social history, past surgical history and problem list.  Review of Systems  ROS negative except as noted above. Information obtained from patient.    Objective:   BP 99/60   Pulse (!) 55   Ht 5\' 8"  (1.727 m)   Wt 141 lb 1 oz (64 kg)   LMP 06/27/2018 (Exact Date)   BMI 21.45 kg/m    CONSTITUTIONAL: Well-developed, well-nourished female in no acute distress.   PSYCHIATRIC: Normal mood and affect. Normal behavior. Normal judgment and thought content.  [place]: Alert and oriented to person, place, and time. Normal muscle tone coordination. No cranial nerve deficit noted.  HENT:  Normocephalic, atraumatic,  External right and left ear normal.  EYES: Conjunctivae and EOM are normal. Pupils are equal and round.   NECK: Normal range of motion, supple, no masses.  Normal thyroid.   SKIN: Skin is warm and dry. No rash noted. Not diaphoretic. No erythema. No pallor.  CARDIOVASCULAR: Normal heart rate noted, regular rhythm, no murmur.  RESPIRATORY: Clear to auscultation bilaterally. Effort and breath sounds normal, no problems with respiration noted.  BREASTS: Symmetric in size. No masses, skin changes, nipple drainage, or lymphadenopathy.  ABDOMEN: Soft, normal bowel sounds, no distention noted.  No tenderness, rebound or guarding.   PELVIC:  External Genitalia: Normal  BUS: Normal  Vagina: Normal  Cervix: Normal  Uterus: Normal  Adnexa: Normal   MUSCULOSKELETAL: Normal range of motion. No tenderness.  No cyanosis, clubbing, or edema.  2+ distal pulses.  LYMPHATIC: No Axillary, Supraclavicular, or Inguinal Adenopathy.  Assessment:   Annual gynecologic examination 33 y.o.   Contraception: condoms   Normal BMI   Problem List Items Addressed This Visit    None    Visit Diagnoses    Well woman exam    -  Primary   Relevant Orders   Cervicovaginal ancillary only   HIV antibody (with reflex)   RPR   Screen for STD (sexually transmitted disease)       Relevant Orders   Cervicovaginal ancillary only   HIV antibody (with reflex)   RPR   Vaginal itching       Relevant Orders   Cervicovaginal ancillary only   Anal itching       Relevant Orders   Cervicovaginal ancillary only      Plan:   Pap: Not needed  Labs: See orders   Routine preventative health maintenance measures emphasized: Exercise/Diet/Weight control, Tobacco Warnings, Alcohol/Substance use risks and Stress Management; see AVS  Reviewed red flag symptoms and when to call  RTC x 1 year for ANNUAL EXAM or sooner if needed   Diona Fanti, CNM [facility], [facility]

## 2018-07-17 NOTE — Patient Instructions (Addendum)
WE WOULD LOVE TO HEAR FROM YOU!!!!   Thank you Jennifer Hanna for visiting [facility].  Providing our patients with the best experience possible is really important to [place], and we hope that you felt that on your recent visit. The most valuable feedback we get comes from [employer]!!    If you receive a survey please take a couple of minutes to let us know how we did.Thank you for continuing to trust [place] with your care.   [facility]    Preventive Care 18-39 Years, Female Preventive care refers to lifestyle choices and visits with your health care provider that can promote health and wellness. What does preventive care include?   A yearly physical exam. This is also called an annual well check.  Dental exams once or twice a year.  Routine eye exams. Ask your health care provider how often you should have your eyes checked.  Personal lifestyle choices, including: ? Daily care of your teeth and gums. ? Regular physical activity. ? Eating a healthy diet. ? Avoiding tobacco and drug use. ? Limiting alcohol use. ? Practicing safe sex. ? Taking vitamin and mineral supplements as recommended by your health care provider. What happens during an annual well check? The services and screenings done by your health care provider during your annual well check will depend on your age, overall health, lifestyle risk factors, and family history of disease. Counseling Your health care provider may ask you questions about your:  Alcohol use.  Tobacco use.  Drug use.  Emotional well-being.  Home and relationship well-being.  Sexual activity.  Eating habits.  Work and work Statistician.  Method of birth control.  Menstrual cycle.  Pregnancy history. Screening You may have the following tests or measurements:  Height, weight, and BMI.  Diabetes screening. This is done by checking your blood sugar (glucose) after you have not eaten for a while  (fasting).  Blood pressure.  Lipid and cholesterol levels. These may be checked every 5 years starting at age 74.  Skin check.  Hepatitis C blood test.  Hepatitis B blood test.  Sexually transmitted disease (STD) testing.  BRCA-related cancer screening. This may be done if you have a family history of breast, ovarian, tubal, or peritoneal cancers.  Pelvic exam and Pap test. This may be done every 3 years starting at age 69. Starting at age 62, this may be done every 5 years if you have a Pap test in combination with an HPV test. Discuss your test results, treatment options, and if necessary, the need for more tests with your health care provider. Vaccines Your health care provider may recommend certain vaccines, such as:  Influenza vaccine. This is recommended every year.  Tetanus, diphtheria, and acellular pertussis (Tdap, Td) vaccine. You may need a Td booster every 10 years.  Varicella vaccine. You may need this if you have not been vaccinated.  HPV vaccine. If you are 54 or younger, you may need three doses over 6 months.  Measles, mumps, and rubella (MMR) vaccine. You may need at least one dose of MMR. You may also need a second dose.  Pneumococcal 13-valent conjugate (PCV13) vaccine. You may need this if you have certain conditions and were not previously vaccinated.  Pneumococcal polysaccharide (PPSV23) vaccine. You may need one or two doses if you smoke cigarettes or if you have certain conditions.  Meningococcal vaccine. One dose is recommended if you are age 82-21 years and a Market researcher living  in a residence hall, or if you have one of several medical conditions. You may also need additional booster doses.  Hepatitis A vaccine. You may need this if you have certain conditions or if you travel or work in places where you may be exposed to hepatitis A.  Hepatitis B vaccine. You may need this if you have certain conditions or if you travel or work in  places where you may be exposed to hepatitis B.  Haemophilus influenzae type b (Hib) vaccine. You may need this if you have certain risk factors. Talk to your health care provider about which screenings and vaccines you need and how often you need them. This information is not intended to replace advice given to you by your health care provider. Make sure you discuss any questions you have with your health care provider. Document Released: 07/03/2001 Document Revised: 12/18/2016 Document Reviewed: 03/08/2015 Elsevier Interactive Patient Education  2019 Elsevier Inc.  Vaginitis  Vaginitis is irritation and swelling (inflammation) of the vagina. It happens when normal bacteria and yeast in the vagina grow too much. There are many types of this condition. Treatment will depend on the type you have. Follow these instructions at home: Lifestyle  Keep your vagina area clean and dry. ? Avoid using soap. ? Rinse the area with water.  Do not do the following until your doctor says it is okay: ? Wash and clean out the vagina (douche). ? Use tampons. ? Have sex.  Wipe from front to back after going to the bathroom.  Let air reach your vagina. ? Wear cotton underwear. ? Do not wear: ? Underwear while you sleep. ? Tight pants. ? Thong underwear. ? Underwear or nylons without a cotton panel. ? Take off any wet clothing, such as bathing suits, as soon as possible.  Use gentle, non-scented products. Do not use things that can irritate the vagina, such as fabric softeners. Avoid the following products if they are scented: ? Feminine sprays. ? Detergents. ? Tampons. ? Feminine hygiene products. ? Soaps or bubble baths.  Practice safe sex and use condoms. General instructions  Take over-the-counter and prescription medicines only as told by your doctor.  If you were prescribed an antibiotic medicine, take or use it as told by your doctor. Do not stop taking or using the antibiotic even if  you start to feel better.  Keep all follow-up visits as told by your doctor. This is important. Contact a doctor if:  You have pain in your belly.  You have a fever.  Your symptoms last for more than 2-3 days. Get help right away if:  You have a fever and your symptoms get worse all of a sudden. Summary  Vaginitis is irritation and swelling of the vagina. It can happen when the normal bacteria and yeast in the vagina grow too much. There are many types.  Treatment will depend on the type you have.  Do not douche, use tampons , or have sex until your health care provider approves. When you can return to sex, practice safe sex and use condoms. This information is not intended to replace advice given to you by your health care provider. Make sure you discuss any questions you have with your health care provider. Document Released: 08/03/2008 Document Revised: 05/29/2016 Document Reviewed: 05/29/2016 Elsevier Interactive Patient Education  2019 Elsevier Inc.  Fibrocystic Breast Changes  Fibrocystic breast changes are changes that can make your breasts swollen or painful. These changes happen when tiny sacs of  fluid (cysts) form in the breast. This is a common condition. It does not mean that you have cancer. It usually happens because of hormone changes during a monthly period. Follow these instructions at home:  Check your breasts after every monthly period. If you do not have monthly periods, check your breasts on the first day of every month. Check for: ? Soreness. ? New swelling or puffiness. ? A change in breast size. ? A change in a lump that was already there.  Take over-the-counter and prescription medicines only as told by your doctor.  Wear a support or sports bra that fits well. Wear this support especially when you are exercising.  Avoid or have less caffeine, fat, and sugar in what you eat and drink as told by your doctor. Contact a doctor if:  You have fluid  coming from your nipple, especially if the fluid has blood in it.  You have new lumps or bumps in your breast.  Your breast gets puffy, red, and painful.  You have changes in how your breast looks.  Your nipple looks flat or it sinks into your breast. Get help right away if:  Your breast turns red, and the redness is spreading. Summary  Fibrocystic breast changes are changes that can make your breasts swollen or painful.  This condition can happen when you have hormone changes during your monthly period.  With this condition, it is important to check your breasts after every monthly period. If you do not have monthly periods, check your breasts on the first day of every month. This information is not intended to replace advice given to you by your health care provider. Make sure you discuss any questions you have with your health care provider. Document Released: 04/19/2008 Document Revised: 01/19/2016 Document Reviewed: 01/19/2016 Elsevier Interactive Patient Education  2019 [employer].

## 2018-07-18 LAB — HIV ANTIBODY (ROUTINE TESTING W REFLEX): HIV Screen 4th Generation wRfx: NONREACTIVE

## 2018-07-18 LAB — RPR: RPR Ser Ql: NONREACTIVE

## 2018-07-21 DIAGNOSIS — M79605 Pain in left leg: Secondary | ICD-10-CM | POA: Diagnosis not present

## 2018-07-21 DIAGNOSIS — E559 Vitamin D deficiency, unspecified: Secondary | ICD-10-CM | POA: Diagnosis not present

## 2018-07-21 DIAGNOSIS — R252 Cramp and spasm: Secondary | ICD-10-CM | POA: Diagnosis not present

## 2018-07-21 DIAGNOSIS — R251 Tremor, unspecified: Secondary | ICD-10-CM | POA: Diagnosis not present

## 2018-07-21 DIAGNOSIS — M79604 Pain in right leg: Secondary | ICD-10-CM | POA: Diagnosis not present

## 2018-07-21 DIAGNOSIS — E538 Deficiency of other specified B group vitamins: Secondary | ICD-10-CM | POA: Diagnosis not present

## 2018-07-22 LAB — CERVICOVAGINAL ANCILLARY ONLY
Bacterial vaginitis: NEGATIVE
CHLAMYDIA, DNA PROBE: NEGATIVE
Candida vaginitis: NEGATIVE
Neisseria Gonorrhea: NEGATIVE
Trichomonas: NEGATIVE

## 2018-07-23 ENCOUNTER — Other Ambulatory Visit: Payer: Self-pay

## 2018-07-23 ENCOUNTER — Ambulatory Visit
Admission: RE | Admit: 2018-07-23 | Discharge: 2018-07-23 | Disposition: A | Discharge status: Home / Self Care | Payer: BLUE CROSS/BLUE SHIELD | Source: Ambulatory Visit | Attending: Neurology | Admitting: Neurology

## 2018-07-23 DIAGNOSIS — R251 Tremor, unspecified: Secondary | ICD-10-CM | POA: Insufficient documentation

## 2018-07-23 LAB — NEUTROPHIL AB TEST LEVEL 1: NEUTROPHIL SCR/PANEL INTERP.: NEGATIVE

## 2018-07-23 MED ORDER — GADOBUTROL 1 MMOL/ML IV SOLN
6.0000 mL | Freq: Once | INTRAVENOUS | Status: AC | PRN
  Administered 2018-07-23: 6 mL via INTRAVENOUS

## 2018-08-26 ENCOUNTER — Other Ambulatory Visit: Payer: BLUE CROSS/BLUE SHIELD

## 2018-08-26 ENCOUNTER — Ambulatory Visit: Payer: BLUE CROSS/BLUE SHIELD | Admitting: Oncology

## 2018-09-03 ENCOUNTER — Ambulatory Visit (INDEPENDENT_AMBULATORY_CARE_PROVIDER_SITE_OTHER): Payer: BLUE CROSS/BLUE SHIELD | Admitting: Family Medicine

## 2018-09-03 ENCOUNTER — Encounter: Payer: Self-pay | Admitting: Family Medicine

## 2018-09-03 ENCOUNTER — Telehealth: Payer: Self-pay | Admitting: Family Medicine

## 2018-09-03 DIAGNOSIS — R0989 Other specified symptoms and signs involving the circulatory and respiratory systems: Secondary | ICD-10-CM | POA: Insufficient documentation

## 2018-09-03 DIAGNOSIS — K219 Gastro-esophageal reflux disease without esophagitis: Secondary | ICD-10-CM

## 2018-09-03 DIAGNOSIS — R198 Other specified symptoms and signs involving the digestive system and abdomen: Secondary | ICD-10-CM

## 2018-09-03 MED ORDER — OMEPRAZOLE 20 MG PO CPDR: 20.0000 mg | DELAYED_RELEASE_CAPSULE | Freq: Two times a day (BID) | ORAL | 3 refills | Status: DC

## 2018-09-03 MED ORDER — ONDANSETRON HCL 4 MG PO TABS: 4.0000 mg | ORAL_TABLET | Freq: Three times a day (TID) | ORAL | 1 refills | Status: DC | PRN

## 2018-09-03 NOTE — Telephone Encounter (Signed)
Called saying she has been having some acid reflux and would like to talk to Dr. B.   CB#  [phone]  Thanks Con Memos

## 2018-09-03 NOTE — Telephone Encounter (Signed)
Patient scheduled for [email or web address]

## 2018-09-03 NOTE — Assessment & Plan Note (Signed)
Ongoing symptoms despite PPI Concerning for esophagitis with possible stricture Needs GI eval and possible EGD, but discussed that this may be delayed due to pandemic Will increase PPI to BID dosing Will add Zofran for prn use Referral to GI placed Discussed strict return precautions

## 2018-09-03 NOTE — Progress Notes (Addendum)
Patient: Jennifer Hanna Female    DOB: 08-13-1985   33 y.o.   MRN: [id number] Visit Date: 09/03/2018  Today's Provider: Lavon Paganini, MD   Chief Complaint  Patient presents with  . Gastroesophageal Reflux   Subjective:    Virtual Visit via Video Note  I connected with Jennifer Hanna on 09/03/18 at  2:00 PM EDT by a video enabled telemedicine application and verified that I am speaking with the correct person using two identifiers.   I discussed the limitations of evaluation and management by telemedicine and the availability of in person appointments. The patient expressed understanding and agreed to proceed.   Patient location: home Provider location: [place] involved in the visit: patient, provider     Gastroesophageal Reflux  She complains of globus sensation. This is a new problem. Episode onset: 2 weeks ago. The problem occurs constantly. The problem has been gradually improving. The symptoms are aggravated by lying down (driving ). Treatments tried: Prilosec. The treatment provided mild relief.   She tried 2 week course of Prilosec 20mg  daily and then had to restart after 4-5 days after.  Symptoms are somewhat better with no dysphagia with liquids when on PPI  She has not had an EGD.  Does have some associated nausea as well  No Known Allergies   Current Outpatient Medications:  .  Cholecalciferol (VITAMIN D3 PO), Take by mouth., Disp: , Rfl:  .  Multiple Vitamin (MULTIVITAMIN) tablet, Take 1 tablet by mouth daily., Disp: , Rfl:  .  omeprazole (PRILOSEC) 20 MG capsule, Take 1 capsule (20 mg total) by mouth 2 (two) times daily before a meal., Disp: 60 capsule, Rfl: 3 .  ondansetron (ZOFRAN) 4 MG tablet, Take 1 tablet (4 mg total) by mouth every 8 (eight) hours as needed for nausea or vomiting., Disp: 20 tablet, Rfl: 1  Review of Systems  Constitutional: Negative.   Cardiovascular: Negative.   Gastrointestinal: Negative.    Musculoskeletal: Negative.     Social History   Tobacco Use  . Smoking status: Never Smoker  . Smokeless tobacco: Never Used  Substance Use Topics  . Alcohol use: Yes    Comment: occass/social      Objective:   There were no vitals taken for this visit. There were no vitals filed for this visit.   Physical Exam Constitutional:      Appearance: Normal appearance.  Pulmonary:     Effort: Pulmonary effort is normal. No respiratory distress.  Neurological:     Mental Status: She is alert and oriented to person, place, and time. Mental status is at baseline.  Psychiatric:        Mood and Affect: Mood normal.        Behavior: Behavior normal.         Assessment & Plan      I discussed the assessment and treatment plan with the patient. The patient was provided an opportunity to ask questions and all were answered. The patient agreed with the plan and demonstrated an understanding of the instructions.   The patient was advised to call back or seek an in-person evaluation if the symptoms worsen or if the condition fails to improve as anticipated.  Problem List Items Addressed This Visit      Digestive   GERD (gastroesophageal reflux disease) - Primary    Ongoing symptoms despite PPI Concerning for esophagitis with possible stricture Needs GI eval and possible EGD, but discussed that  this may be delayed due to pandemic Will increase PPI to BID dosing Will add Zofran for prn use Referral to GI placed Discussed strict return precautions       Relevant Medications   omeprazole (PRILOSEC) 20 MG capsule   ondansetron (ZOFRAN) 4 MG tablet   Other Relevant Orders   Ambulatory referral to Gastroenterology     Other   Globus sensation    Ongoing symptoms despite PPI Concerning for esophagitis with possible stricture Needs GI eval and possible EGD, but discussed that this may be delayed due to pandemic Will increase PPI to BID dosing Will add Zofran for prn use  Referral to GI placed Discussed strict return precautions      Relevant Orders   Ambulatory referral to Gastroenterology       Return if symptoms worsen or fail to improve.   The entirety of the information documented in the History of Present Illness, Review of Systems and Physical Exam were personally obtained by me. Portions of this information were initially documented by Tiburcio Pea, CMA and reviewed by me for thoroughness and accuracy.    Virginia Crews, MD, MPH [facility] 09/03/2018 2:43 PM

## 2018-09-03 NOTE — Patient Instructions (Signed)

## 2018-09-08 ENCOUNTER — Encounter: Payer: Self-pay | Admitting: *Deleted

## 2018-10-23 ENCOUNTER — Encounter: Payer: Self-pay | Admitting: Gastroenterology

## 2018-10-23 ENCOUNTER — Telehealth: Payer: Self-pay

## 2018-10-23 ENCOUNTER — Other Ambulatory Visit: Payer: Self-pay

## 2018-10-23 ENCOUNTER — Ambulatory Visit: Payer: BLUE CROSS/BLUE SHIELD | Admitting: Gastroenterology

## 2018-10-23 VITALS — BP 122/75 | HR 61 | Temp 97.7°F | Ht 68.0 in | Wt 145.0 lb

## 2018-10-23 DIAGNOSIS — K219 Gastro-esophageal reflux disease without esophagitis: Secondary | ICD-10-CM | POA: Diagnosis not present

## 2018-10-23 MED ORDER — OMEPRAZOLE 40 MG PO CPDR: 40.0000 mg | DELAYED_RELEASE_CAPSULE | Freq: Every day | ORAL | 0 refills | Status: DC

## 2018-10-23 NOTE — Telephone Encounter (Signed)
LVM to let patient know her lab (H-Pylori Breath Test) has been ordered and she may go to [facility] to have it done. Order has been released.  Thanks [employer]

## 2018-10-23 NOTE — Addendum Note (Signed)
Addended by: Vanetta Mulders on: 10/23/2018 04:32 PM   Modules accepted: Orders

## 2018-10-23 NOTE — Progress Notes (Signed)
Jennifer Hanna [address]  [place]  [place], [place] [address]  Main: [phone]  Fax: [phone]   Gastroenterology Consultation  Referring Provider:     Virginia Crews, MD Primary Care Physician:  Virginia Crews, MD Reason for Consultation:     Globus sensation, GERD        HPI:   Chief complaint: GERD  Jennifer Hanna is a 33 y.o. y/o female referred for consultation & management  by Dr. Brita Romp, Dionne Bucy, MD.  Patient reports 56-month history of heartburn occurring every other day.  Also reports about 1 hour after eating she will feel throat tightness, and will have to clear her throat.  She denies any dysphagia with liquids or solids.  The above symptoms do not occur when she is not eating.  For example, she states today she has not had lunch and does not feel any throat tightness or not having to clear her throat.  The symptoms were not present before 2 months.  They have overall improved but are still present.  She took omeprazole twice daily for 2 weeks and it helped and she stopped taking it since then and is only taking it as needed.  Took it twice last week.  The patient denies abdominal or flank pain, anorexia, nausea or vomiting, dysphagia, change in bowel habits or black or bloody stools or weight loss.   Past Medical History:  Diagnosis Date  . Tremors of nervous system     Past Surgical History:  Procedure Laterality Date  . KNEE ARTHROSCOPY W/ ACL RECONSTRUCTION Left 2007    Prior to Admission medications   Medication Sig Start Date End Date Taking? Authorizing Provider  Cholecalciferol (VITAMIN D3 PO) Take by mouth.    [provider]  Multiple Vitamin (MULTIVITAMIN) tablet Take 1 tablet by mouth daily.    [provider]  ondansetron (ZOFRAN) 4 MG tablet Take 1 tablet (4 mg total) by mouth every 8 (eight) hours as needed for nausea or vomiting. 09/03/18   Virginia Crews, MD    Family History   Problem Relation Age of Onset  . Hypertension Father   . Breast cancer Mother 68  . Hypertension Sister   . Healthy Brother   . Colon cancer Neg Hx   . Ovarian cancer Neg Hx      Social History   Tobacco Use  . Smoking status: Never Smoker  . Smokeless tobacco: Never Used  Substance Use Topics  . Alcohol use: Yes    Comment: occass/social  . Drug use: No    Allergies as of 10/23/2018  . (No Known Allergies)    Review of Systems:    All systems reviewed and negative except where noted in HPI.   Physical Exam:  Vitals reviewed No LMP recorded. Psych:  Alert and cooperative. Normal mood and affect. General:   Alert,  Well-developed, well-nourished, pleasant and cooperative in NAD Head:  Normocephalic and atraumatic. Eyes:  Sclera clear, no icterus.   Conjunctiva pink. Ears:  Normal auditory acuity. Nose:  No deformity, discharge, or lesions. Mouth:  No deformity or lesions,oropharynx pink & moist. Neck:  Supple; no masses or thyromegaly. Abdomen:  Normal bowel sounds.  No bruits.  Soft, non-tender and non-distended without masses, hepatosplenomegaly or hernias noted.  No guarding or rebound tenderness.    Msk:  Symmetrical without gross deformities. Good, equal movement & strength bilaterally. Pulses:  Normal pulses noted. Extremities:  No clubbing or edema.  No cyanosis. Neurologic:  Alert and oriented x3;  grossly normal neurologically. Skin:  Intact without significant lesions or rashes. No jaundice. Lymph Nodes:  No significant cervical adenopathy. Psych:  Alert and cooperative. Normal mood and affect.   Labs: CBC    Component Value Date/Time   WBC 3.9 (L) 07/14/2018 1405   RBC 4.35 07/14/2018 1405   HGB 13.4 07/14/2018 1405   HGB 11.9 06/25/2018 0934   HCT 40.7 07/14/2018 1405   HCT 36.5 06/25/2018 0934   PLT 233 07/14/2018 1405   PLT 222 06/25/2018 0934   MCV 93.6 07/14/2018 1405   MCV 93 06/25/2018 0934   MCH 30.8 07/14/2018 1405   MCHC 32.9  07/14/2018 1405   RDW 12.8 07/14/2018 1405   RDW 12.9 06/25/2018 0934   LYMPHSABS 1.6 07/14/2018 1405   LYMPHSABS 1.6 06/25/2018 0934   MONOABS 0.3 07/14/2018 1405   EOSABS 0.0 07/14/2018 1405   EOSABS 0.0 06/25/2018 0934   BASOSABS 0.0 07/14/2018 1405   BASOSABS 0.0 06/25/2018 0934   CMP     Component Value Date/Time   NA 140 05/02/2018 0945   K 4.1 05/02/2018 0945   CL 104 05/02/2018 0945   CO2 22 05/02/2018 0945   GLUCOSE 80 05/02/2018 0945   BUN 13 05/02/2018 0945   CREATININE 0.94 05/02/2018 0945   CALCIUM 9.2 05/02/2018 0945   PROT 7.0 05/02/2018 0945   ALBUMIN 4.1 05/02/2018 0945   AST 21 05/02/2018 0945   ALT 18 05/02/2018 0945   ALKPHOS 37 (L) 05/02/2018 0945   BILITOT 0.7 05/02/2018 0945   GFRNONAA 80 05/02/2018 0945   GFRAA 92 05/02/2018 0945    Imaging Studies: No results found.  Assessment and Plan:   Jennifer Hanna is a 33 y.o. y/o female has been referred for globus sensation, GERD  Patient does not have any true dysphagia The feeling of throat tightness about 1/2-hour after eating combined with clearing her throat at the time and heartburn every other day, could suggest that all her symptoms are related to GERD.  She has not taken a complete course of PPI yet as she only took omeprazole for 2 weeks, which helped and then she stopped taking it.  We discussed the options of conservative management with medications to see if her symptoms are associated with EGD at this time  she prefers medication management at this time.  We will change omeprazole to 40 mg once daily for 30 days only and reassess symptoms in 6 to 8 weeks.  In the meantime if symptoms worsen or change patient was asked to notify Jennifer Hanna and she verbalized understanding.  If symptoms do not improve, patient will need an EGD for further evaluation  (Risks of PPI use were discussed with patient including bone loss, C. Diff diarrhea, pneumonia, infections, CKD, electrolyte abnormalities.   If clinically possible based on symptoms, goal would be to maintain patient on the lowest dose possible, or discontinue the medication with institution of acid reflux lifestyle modifications over time. Pt. Verbalizes understanding and chooses to continue the medication.)  Patient educated extensively on acid reflux lifestyle modification, including buying a bed wedge, not eating 3 hrs before bedtime, diet modifications, and handout given for the same.    Dr Jennifer Hanna  Speech recognition software was used to dictate the above note.

## 2018-10-30 ENCOUNTER — Telehealth: Payer: Self-pay | Admitting: Gastroenterology

## 2018-10-30 NOTE — Telephone Encounter (Signed)
Pt left vm stating she missed a call stating that she can get her Lab work done she is calling to verify that please call pt

## 2018-10-30 NOTE — Telephone Encounter (Signed)
Returned patients call.  Informed her that her H-Pylori lab was entered last week and she may go to any [facility] station to have it done.  Thanks [employer]

## 2018-10-31 DIAGNOSIS — K219 Gastro-esophageal reflux disease without esophagitis: Secondary | ICD-10-CM | POA: Diagnosis not present

## 2018-11-01 LAB — H. PYLORI BREATH TEST: H pylori Breath Test: NEGATIVE

## 2018-11-13 ENCOUNTER — Other Ambulatory Visit: Payer: Self-pay | Admitting: Gastroenterology

## 2018-11-18 ENCOUNTER — Ambulatory Visit: Payer: BLUE CROSS/BLUE SHIELD | Admitting: Gastroenterology

## 2018-11-21 ENCOUNTER — Other Ambulatory Visit: Payer: Self-pay

## 2018-11-21 DIAGNOSIS — Z20822 Contact with and (suspected) exposure to covid-19: Secondary | ICD-10-CM

## 2018-11-24 ENCOUNTER — Other Ambulatory Visit: Payer: Self-pay

## 2018-11-24 DIAGNOSIS — Z20822 Contact with and (suspected) exposure to covid-19: Secondary | ICD-10-CM

## 2018-11-24 DIAGNOSIS — R6889 Other general symptoms and signs: Secondary | ICD-10-CM | POA: Diagnosis not present

## 2018-11-27 ENCOUNTER — Ambulatory Visit: Payer: BC Managed Care – PPO | Admitting: Gastroenterology

## 2018-11-27 ENCOUNTER — Other Ambulatory Visit: Payer: Self-pay

## 2018-11-27 VITALS — BP 105/80 | HR 80 | Temp 97.7°F

## 2018-11-27 DIAGNOSIS — K219 Gastro-esophageal reflux disease without esophagitis: Secondary | ICD-10-CM

## 2018-11-28 NOTE — Progress Notes (Signed)
[name], MD [address]  [place]  [place], [place] [address]  Main: [phone]  Fax: [phone]   Primary Care Physician: Virginia Crews, MD   Chief Complaint  Patient presents with  . Follow up GERD    HPI: Geetika Laborde is a 33 y.o. female here for follow-up of GERD.  Patient has 2 more days of her omeprazole left.  States her symptoms are not completely well controlled.  The patient denies abdominal or flank pain, anorexia, nausea or vomiting, dysphagia, change in bowel habits or black or bloody stools or weight loss.    Current Outpatient Medications  Medication Sig Dispense Refill  . Cholecalciferol (VITAMIN D3 PO) Take by mouth.    . Multiple Vitamin (MULTIVITAMIN) tablet Take 1 tablet by mouth daily.    Marland Kitchen omeprazole (PRILOSEC) 40 MG capsule Take 1 capsule (40 mg total) by mouth daily for 30 days. 30 capsule 0  . ondansetron (ZOFRAN) 4 MG tablet Take 1 tablet (4 mg total) by mouth every 8 (eight) hours as needed for nausea or vomiting. 20 tablet 1   No current facility-administered medications for this visit.     Allergies as of 11/27/2018  . (No Known Allergies)    ROS:  General: Negative for anorexia, weight loss, fever, chills, fatigue, weakness. ENT: Negative for hoarseness, difficulty swallowing , nasal congestion. CV: Negative for chest pain, angina, palpitations, dyspnea on exertion, peripheral edema.  Respiratory: Negative for dyspnea at rest, dyspnea on exertion, cough, sputum, wheezing.  GI: See history of present illness. GU:  Negative for dysuria, hematuria, urinary incontinence, urinary frequency, nocturnal urination.  Endo: Negative for unusual weight change.    Physical Examination:   BP 105/80   Pulse 80   Temp 97.7 F (36.5 C)   General: Well-nourished, well-developed in no acute distress.  Eyes: No icterus. Conjunctivae pink. Mouth: Oropharyngeal mucosa moist and pink , no lesions erythema or exudate.  Neck: Supple, Trachea midline Abdomen: Bowel sounds are normal, nontender, nondistended, no hepatosplenomegaly or masses, no abdominal bruits or hernia , no rebound or guarding.   Extremities: No lower extremity edema. No clubbing or deformities. Neuro: Alert and oriented x 3.  Grossly intact. Skin: Warm and dry, no jaundice.   Psych: Alert and cooperative, normal mood and affect.   Labs: CMP     Component Value Date/Time   NA 140 05/02/2018 0945   K 4.1 05/02/2018 0945   CL 104 05/02/2018 0945   CO2 22 05/02/2018 0945   GLUCOSE 80 05/02/2018 0945   BUN 13 05/02/2018 0945   CREATININE 0.94 05/02/2018 0945   CALCIUM 9.2 05/02/2018 0945   PROT 7.0 05/02/2018 0945   ALBUMIN 4.1 05/02/2018 0945   AST 21 05/02/2018 0945   ALT 18 05/02/2018 0945   ALKPHOS 37 (L) 05/02/2018 0945   BILITOT 0.7 05/02/2018 0945   GFRNONAA 80 05/02/2018 0945   GFRAA 92 05/02/2018 0945   Lab Results  Component Value Date   WBC 3.9 (L) 07/14/2018   HGB 13.4 07/14/2018   HCT 40.7 07/14/2018   MCV 93.6 07/14/2018   PLT 233 07/14/2018    Imaging Studies: No results found.  Assessment and Plan:   Gwendoline Judy is a 33 y.o. y/o female with GERD, now complete resolution of symptoms with once daily omeprazole  Patient asked to complete 2 more days of the omeprazole left and we will not refill it any further  Patient educated extensively on acid reflux  lifestyle modification, including buying a bed wedge, not eating 3 hrs before bedtime, diet modifications, and handout given for the same.   If symptoms recur, I have asked patient to contact us and we may consider EGD at that time  Patient is also willing to try other alternatives other than omeprazole like Pepcid if symptoms recur.  However, lifestyle modifications are important to prevent recurrence of symptoms and this was discussed with her in detail  Patient would not like a follow-up appointment and would like to follow-up as needed     Dr [name]

## 2018-11-29 LAB — NOVEL CORONAVIRUS, NAA: SARS-CoV-2, NAA: NOT DETECTED

## 2019-02-08 IMAGING — CR DG RIBS 2V*L*
1 series · 4 of 4 positions shown · non-contrast
Comparison: Radiographs January 10, 2017.

CLINICAL DATA: Nodule of anterior chest wall.

EXAM:
LEFT RIBS - 2 VIEW

[Series 1: dg ribs unilateral left · 0.14mm/px · 4 of 4 slices shown]
[im 1/4]
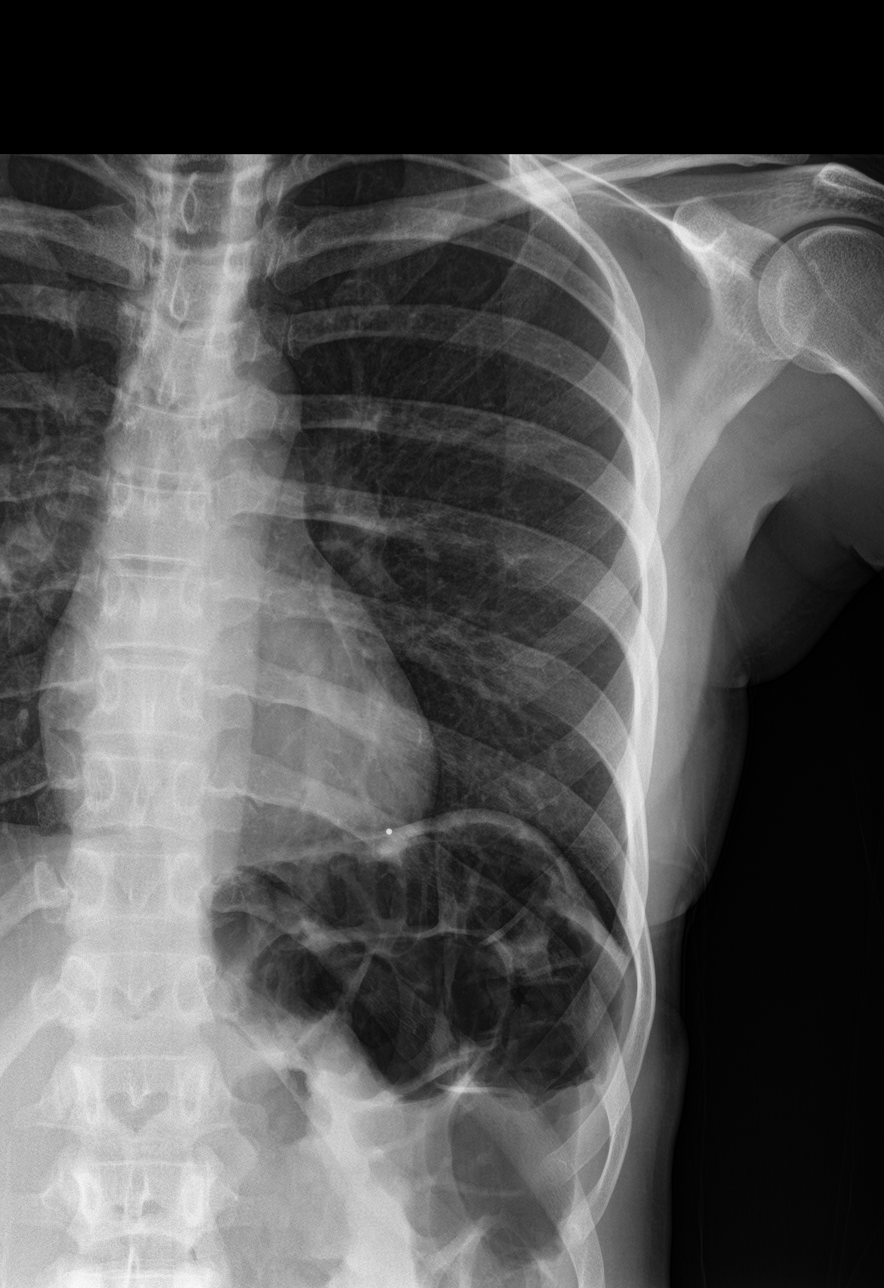
[im 2/4]
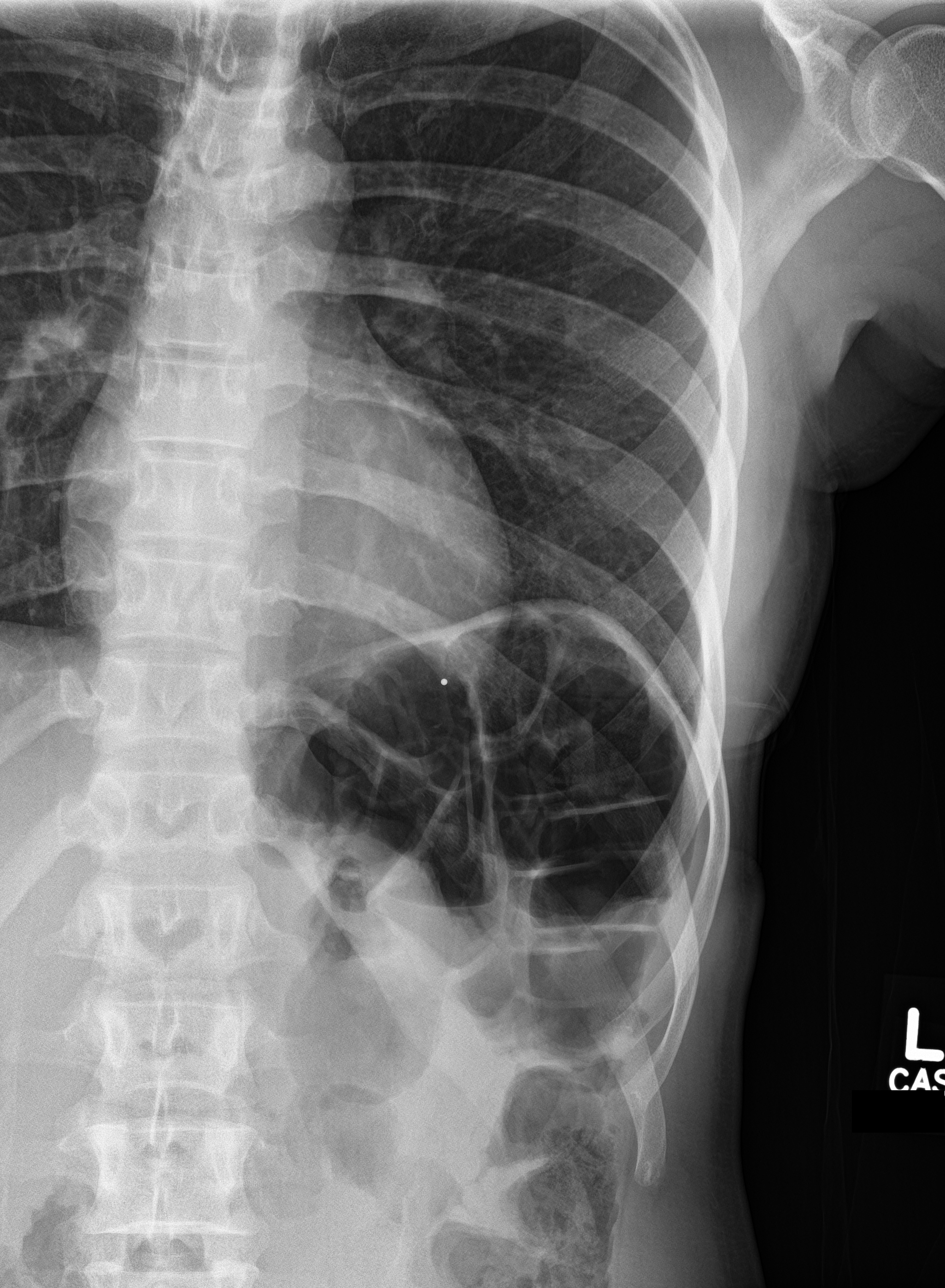
[im 3/4]
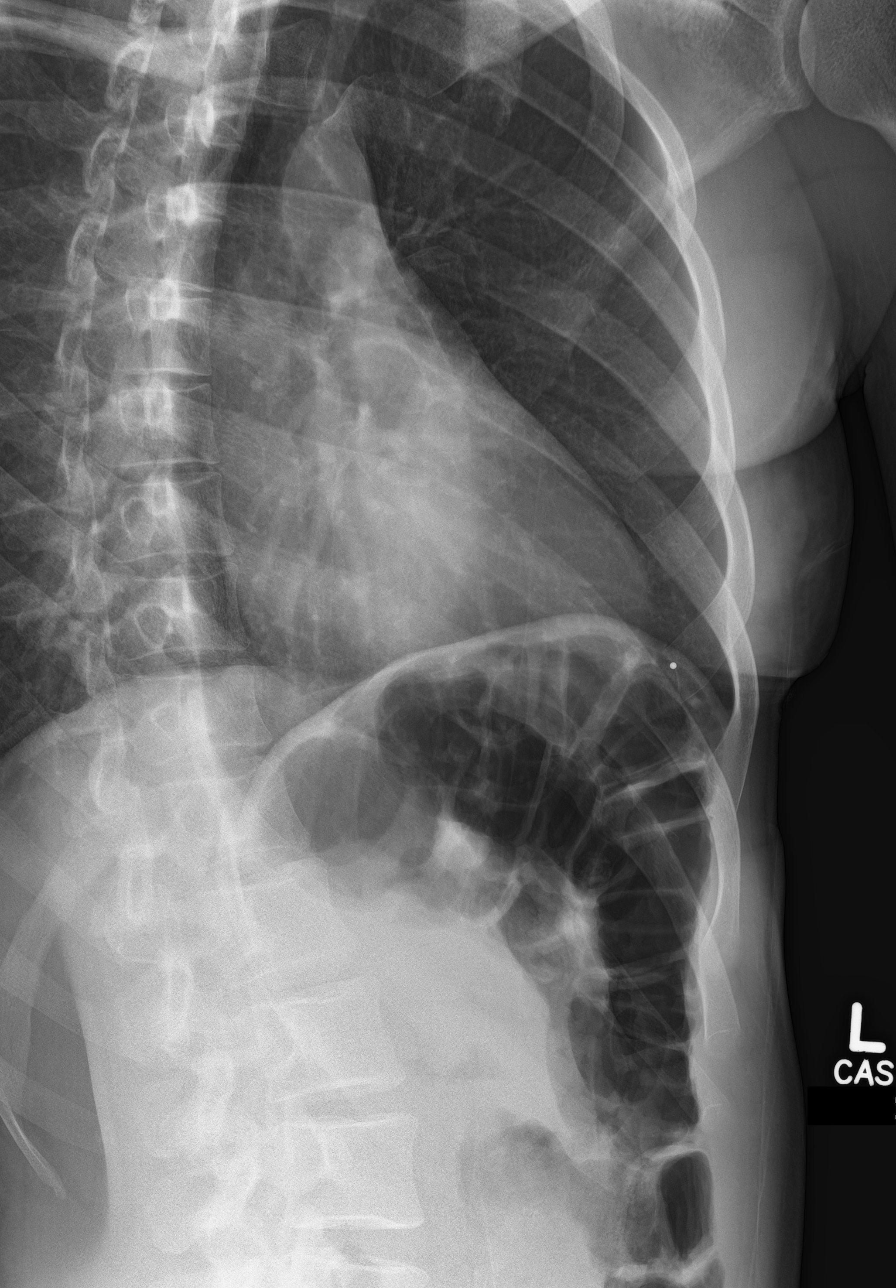
[im 4/4]
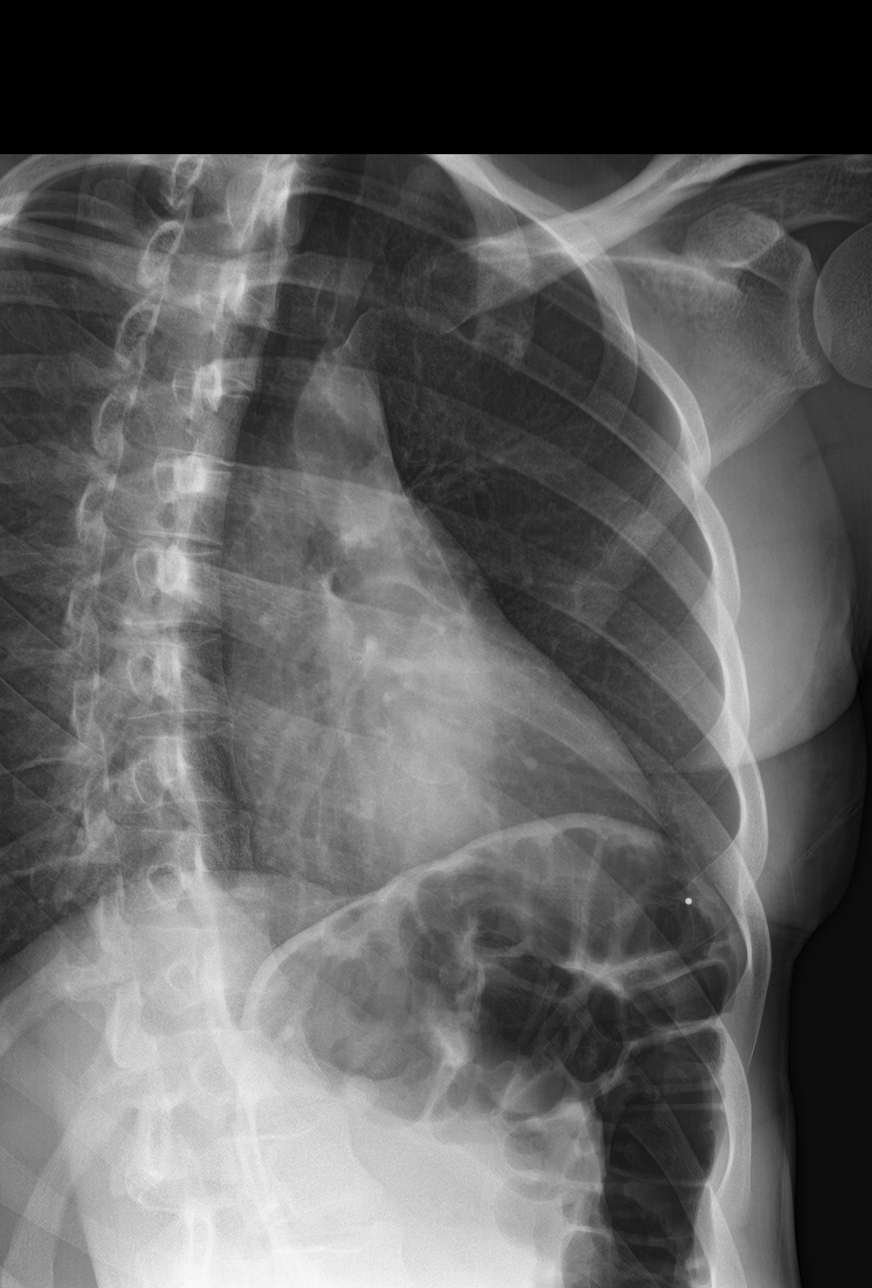

[4 of 4 positions shown; findings below may reference images not displayed]

FINDINGS: No fracture or other bone lesions are seen involving the ribs.
IMPRESSION: Negative.

## 2019-02-13 ENCOUNTER — Ambulatory Visit: Payer: BC Managed Care – PPO | Admitting: Family Medicine

## 2019-02-13 ENCOUNTER — Encounter: Payer: Self-pay | Admitting: Family Medicine

## 2019-02-13 ENCOUNTER — Other Ambulatory Visit: Payer: Self-pay

## 2019-02-13 VITALS — BP 106/72 | HR 63 | Temp 96.9°F | Wt 144.0 lb

## 2019-02-13 DIAGNOSIS — M62838 Other muscle spasm: Secondary | ICD-10-CM

## 2019-02-13 MED ORDER — CYCLOBENZAPRINE HCL 5 MG PO TABS: 5.0000 mg | ORAL_TABLET | Freq: Three times a day (TID) | ORAL | 1 refills | Status: DC | PRN

## 2019-02-13 MED ORDER — MELOXICAM 7.5 MG PO TABS: 7.5000 mg | ORAL_TABLET | Freq: Every day | ORAL | 0 refills | Status: DC

## 2019-02-13 NOTE — Patient Instructions (Signed)
Acute Torticollis, Adult Torticollis is a condition in which the muscles of the neck tighten (contract) abnormally, causing the neck to twist and the head to move into an unnatural position. Torticollis that develops suddenly is called acute torticollis. People with acute torticollis may have trouble turning their head. The condition can be painful and may range from mild to severe. What are the causes? This condition may be caused by:  Sleeping in an awkward position (common).  Extending or twisting the neck muscles beyond their normal position.  An injury to the neck muscles.  An infection.  A tumor.  Certain medicines.  Long-lasting spasms of the neck muscles. In some cases, the cause may not be known. What increases the risk? You are more likely to develop this condition if:  You have a condition associated with loose ligaments, such as Down syndrome.  You have a brain condition that affects vision, such as strabismus. What are the signs or symptoms? The main symptom of this condition is tilting of the head to one side. Other symptoms include:  Pain in the neck.  Trouble turning the head from side to side or up and down. How is this diagnosed? This condition may be diagnosed based on:  A physical exam.  Your medical history.  Imaging tests, such as: ? An X-ray. ? An ultrasound. ? A CT scan. ? An MRI. How is this treated? Treatment for this condition depends on what is causing the condition. Mild cases may go away without treatment. Treatment for more serious cases may include:  Medicines or shots to relax the muscles.  Other medicines, such as antibiotics to treat the underlying cause.  Wearing a soft neck collar.  Physical therapy and stretching to improve neck strength and flexibility.  Neck massage. In severe cases, surgery may be needed to repair dislocated or broken bones or to treat nerves in the neck. Follow these instructions at home:   Take  over-the-counter and prescription medicines only as told by your health care provider.  Do stretching exercises and massage your neck as told by your health care provider.  If directed, apply heat to the affected area as often as told by your health care provider. Use the heat source that your health care provider recommends, such as a moist heat pack or a heating pad. ? Place a towel between your skin and the heat source. ? Leave the heat on for 20-30 minutes. ? Remove the heat if your skin turns bright red. This is especially important if you are unable to feel pain, heat, or cold. You may have a greater risk of getting burned.  If you wake up with torticollis after sleeping, check your bed or sleeping area. Look for lumpy pillows or unusual objects. Make sure your bed and sleeping area are comfortable.  Keep all follow-up visits as told by your health care provider. This is important. Contact a health care provider if:  You have a fever.  Your symptoms do not improve or they get worse. Get help right away if:  You have trouble breathing.  You develop noisy breathing (stridor).  You start to drool.  You have trouble swallowing or pain when swallowing.  You develop numbness or weakness in your hands or feet.  You have changes in your speech, understanding, or vision.  You are in severe pain.  You cannot move your head or neck. Summary  Torticollis is a condition in which the muscles of the neck tighten (contract) abnormally, causing   the neck to twist and the head to move into an unnatural position. Torticollis that develops suddenly is called acute torticollis.  Treatment for this condition depends on what is causing the condition. Mild cases may go away without treatment.  Do stretching exercises and massage your neck as told by your health care provider. You may also be instructed to apply heat to the area.  Contact your health care provider if your symptoms do not  improve or they get worse. This information is not intended to replace advice given to you by your health care provider. Make sure you discuss any questions you have with your health care provider. Document Released: 05/04/2000 Document Revised: 04/19/2017 Document Reviewed: 07/05/2016 Elsevier Patient Education  2020 [employer]  

## 2019-02-13 NOTE — Progress Notes (Signed)
Patient: Jennifer Hanna Female    DOB: 1985-06-06   33 y.o.   MRN: [id number] Visit Date: 02/13/2019  Today's Provider: Lavon Paganini, MD   Chief Complaint  Patient presents with  . Neck Pain   Subjective:    I, Tiburcio Pea, CMA, am acting as a scribe for Lavon Paganini, MD.    Neck Pain  This is a new problem. Episode onset: approximately 2 months ago. The problem occurs constantly. The problem has been unchanged. The pain is present in the right side. Quality: soreness. Nothing aggravates the symptoms. Associated symptoms comments: Rash and a "lump" . She has tried nothing for the symptoms.   Tried chiropratic care - helps some R neck to shoulder pain and tightness Tried stretching and icing, icy hot Going on for a few months   Lump was present earlier in the month, resolved and then came back. Took sinus medication for it.  Was pink 2 days ago. Tender and raised.   No Known Allergies   Current Outpatient Medications:  .  Cholecalciferol (VITAMIN D3 PO), Take by mouth., Disp: , Rfl:  .  Multiple Vitamin (MULTIVITAMIN) tablet, Take 1 tablet by mouth daily., Disp: , Rfl:  .  ondansetron (ZOFRAN) 4 MG tablet, Take 1 tablet (4 mg total) by mouth every 8 (eight) hours as needed for nausea or vomiting., Disp: 20 tablet, Rfl: 1 .  omeprazole (PRILOSEC) 40 MG capsule, Take 1 capsule (40 mg total) by mouth daily for 30 days., Disp: 30 capsule, Rfl: 0  Review of Systems  Constitutional: Negative.   Respiratory: Negative.   Cardiovascular: Negative.   Musculoskeletal: Positive for neck pain.    Social History   Tobacco Use  . Smoking status: Never Smoker  . Smokeless tobacco: Never Used  Substance Use Topics  . Alcohol use: Yes    Comment: occass/social      Objective:   BP 106/72 (BP Location: Right Arm, Patient Position: Sitting, Cuff Size: Normal)   Pulse 63   Temp (!) 96.9 F (36.1 C) (Temporal)   Wt 144 lb (65.3 kg)   SpO2 99%   BMI 21.90  kg/m  Vitals:   02/13/19 0822  BP: 106/72  Pulse: 63  Temp: (!) 96.9 F (36.1 C)  TempSrc: Temporal  SpO2: 99%  Weight: 144 lb (65.3 kg)  Body mass index is 21.9 kg/m.   Physical Exam Vitals signs reviewed.  Constitutional:      General: She is not in acute distress.    Appearance: Normal appearance. She is not diaphoretic.  HENT:     Head: Normocephalic and atraumatic.  Eyes:     General: No scleral icterus.    Conjunctiva/sclera: Conjunctivae normal.  Neck:     Musculoskeletal: Neck supple. Decreased range of motion (in all planes). Muscular tenderness (tenderness and spasm of SCM and Traps bilaterally (R>L)) present.     Thyroid: No thyromegaly.  Cardiovascular:     Rate and Rhythm: Normal rate and regular rhythm.     Pulses: Normal pulses.     Heart sounds: Normal heart sounds. No murmur.  Pulmonary:     Effort: Pulmonary effort is normal. No respiratory distress.     Breath sounds: Normal breath sounds. No wheezing.  Musculoskeletal:     Right lower leg: No edema.     Left lower leg: No edema.  Lymphadenopathy:     Cervical: No cervical adenopathy.  Skin:    General: Skin is warm  and dry.     Capillary Refill: Capillary refill takes less than 2 seconds.     Findings: No rash.  Neurological:     Mental Status: She is alert and oriented to person, place, and time. Mental status is at baseline.     Sensory: No sensory deficit.     Motor: No weakness.     Gait: Gait normal.  Psychiatric:        Mood and Affect: Mood normal.        Behavior: Behavior normal.     No results found for any visits on 02/13/19.     Assessment & Plan   1. Muscle spasms of neck - new problem, but ongoing for months - has significant spasm of SCM and Trapezius muscles bilaterally - Decreased ROM is related to muscle spasm - "lump" on R side of neck is SCM muscle body - no rash - trial of meloxicam, flexeril - try massage, heat, stretching - return precautions discussed    Meds ordered this encounter  Medications  . cyclobenzaprine (FLEXERIL) 5 MG tablet    Sig: Take 1 tablet (5 mg total) by mouth 3 (three) times daily as needed for muscle spasms.    Dispense:  30 tablet    Refill:  1  . meloxicam (MOBIC) 7.5 MG tablet    Sig: Take 1 tablet (7.5 mg total) by mouth daily.    Dispense:  30 tablet    Refill:  0     Return if symptoms worsen or fail to improve.   The entirety of the information documented in the History of Present Illness, Review of Systems and Physical Exam were personally obtained by me. Portions of this information were initially documented by Tiburcio Pea , CMA and reviewed by me for thoroughness and accuracy.    Bacigalupo, Dionne Bucy, MD MPH [facility]

## 2019-02-15 ENCOUNTER — Encounter: Payer: Self-pay | Admitting: Family Medicine

## 2019-02-25 ENCOUNTER — Encounter: Payer: Self-pay | Admitting: Family Medicine

## 2019-02-25 DIAGNOSIS — R200 Anesthesia of skin: Secondary | ICD-10-CM | POA: Diagnosis not present

## 2019-03-08 ENCOUNTER — Other Ambulatory Visit: Payer: Self-pay | Admitting: Family Medicine

## 2019-03-10 ENCOUNTER — Ambulatory Visit: Payer: BC Managed Care – PPO | Admitting: Gastroenterology

## 2019-03-10 ENCOUNTER — Other Ambulatory Visit: Payer: Self-pay

## 2019-03-10 ENCOUNTER — Encounter: Payer: Self-pay | Admitting: Gastroenterology

## 2019-03-10 VITALS — BP 120/64 | HR 82 | Temp 98.2°F | Ht 68.0 in | Wt 143.1 lb

## 2019-03-10 DIAGNOSIS — K921 Melena: Secondary | ICD-10-CM

## 2019-03-10 MED ORDER — POLYETHYLENE GLYCOL 3350 17 G PO PACK: 17.0000 g | PACK | Freq: Every day | ORAL | 0 refills | Status: DC

## 2019-03-10 NOTE — Progress Notes (Signed)
Jennifer Antigua, MD [address]  [place]  [place],  [address]  Main: [phone]  Fax: [phone]   Primary Care Physician: Jennifer Crews, MD   Chief Complaint  Patient presents with  . Blood In Stools    Patient states this is every bowel movement     HPI: Jennifer Hanna is a 33 y.o. female who reports 2-week history of blood streaks in stool.  Describes this as small amount of blood streaks within brown stool.  No blood in the toilet water itself.  Denies feeling any hemorrhoids.  No abdominal pain, nausea or vomiting.  No heartburn or dysphagia.  States she took 2 pills of meloxicam over 2 days but due to the blood she stopped taking it.  No melena.  No prior history of GI bleed.  No family history of colon cancer.  No prior colonoscopy.  Reports a bowel movement every other day.  Denies straining with it.  States bowel movements are formed.  She took a picture of 1 her of her bowel movements that had the blood in it and showed it to me.  It appears to be a solid brown stool with 1 very small streak of red blood in it  Current Outpatient Medications  Medication Sig Dispense Refill  . Cholecalciferol (VITAMIN D3 PO) Take by mouth.    . Multiple Vitamin (MULTIVITAMIN) tablet Take 1 tablet by mouth daily.    . polyethylene glycol (MIRALAX) 17 g packet Take 17 g by mouth daily. 30 each 0   No current facility-administered medications for this visit.     Allergies as of 03/10/2019  . (No Known Allergies)    ROS:  General: Negative for anorexia, weight loss, fever, chills, fatigue, weakness. ENT: Negative for hoarseness, difficulty swallowing , nasal congestion. CV: Negative for chest pain, angina, palpitations, dyspnea on exertion, peripheral edema.  Respiratory: Negative for dyspnea at rest, dyspnea on exertion, cough, sputum, wheezing.  GI: See history of present illness. GU:  Negative for dysuria, hematuria, urinary incontinence, urinary  frequency, nocturnal urination.  Endo: Negative for unusual weight change.    Physical Examination:   BP 120/64 (BP Location: Left Arm, Patient Position: Sitting)   Pulse 82   Temp 98.2 F (36.8 C) (Oral)   Ht 5\' 8"  (1.727 m)   Wt 143 lb 2 oz (64.9 kg)   BMI 21.76 kg/m   General: Well-nourished, well-developed in no acute distress.  Eyes: No icterus. Conjunctivae pink. Mouth: Oropharyngeal mucosa moist and pink , no lesions erythema or exudate. Neck: Supple, Trachea midline Abdomen: Bowel sounds are normal, nontender, nondistended, no hepatosplenomegaly or masses, no abdominal bruits or hernia , no rebound or guarding.   Rectal exam: No external lesions or hemorrhoids.  DRE with liquid yellow stool in the rectal vault.  No blood Extremities: No lower extremity edema. No clubbing or deformities. Neuro: Alert and oriented x 3.  Grossly intact. Skin: Warm and dry, no jaundice.   Psych: Alert and cooperative, normal mood and affect.   Labs: CMP     Component Value Date/Time   NA 140 05/02/2018 0945   K 4.1 05/02/2018 0945   CL 104 05/02/2018 0945   CO2 22 05/02/2018 0945   GLUCOSE 80 05/02/2018 0945   BUN 13 05/02/2018 0945   CREATININE 0.94 05/02/2018 0945   CALCIUM 9.2 05/02/2018 0945   PROT 7.0 05/02/2018 0945   ALBUMIN 4.1 05/02/2018 0945   AST 21 05/02/2018 0945  ALT 18 05/02/2018 0945   ALKPHOS 37 (L) 05/02/2018 0945   BILITOT 0.7 05/02/2018 0945   GFRNONAA 80 05/02/2018 0945   GFRAA 92 05/02/2018 0945   Lab Results  Component Value Date   WBC 3.9 (L) 07/14/2018   HGB 13.4 07/14/2018   HCT 40.7 07/14/2018   MCV 93.6 07/14/2018   PLT 233 07/14/2018    Imaging Studies: No results found.  Assessment and Plan:   [name] is a 33 y.o. y/o female blood in stool for the last 2 weeks  Based on the pictures patient has showed me, and her signs and symptoms, I suspect that the streaks of blood in her stool is from underlying internal hemorrhoids   However, we did discuss with her that malignancy cannot be ruled out until visualization with colonoscopy is done.  Given her age and no family history this risk is low but not 0  We discussed the options of proceeding with colonoscopy and discussed risks and benefits in detail.  Patient would like to continue with conservative management at this time and if symptoms do not resolve, consider colonoscopy.  I have asked her to start taking MiraLAX daily with goal of 1 soft bowel movement daily.  Continue for 2 to 4 weeks.  This may help with bleeding of any underlying internal hemorrhoids.  Hold for 1 to 2 days with 4 loose stools  We will obtain CBC today to ensure she does not have any anemia  High-fiber diet discussed as well  However, if symptoms get worse or do not improve, patient was asked to notify us and she verbalized understanding  Follow-up given to check on symptoms in 1 to 2 months  Dr Jennifer Hanna

## 2019-03-10 NOTE — Patient Instructions (Signed)

## 2019-03-11 LAB — CBC
Hematocrit: 38.8 % (ref 34.0–46.6)
Hemoglobin: 12.9 g/dL (ref 11.1–15.9)
MCH: 31.2 pg (ref 26.6–33.0)
MCHC: 33.2 g/dL (ref 31.5–35.7)
MCV: 94 fL (ref 79–97)
Platelets: 267 10*3/uL (ref 150–450)
RBC: 4.13 x10E6/uL (ref 3.77–5.28)
RDW: 12.6 % (ref 11.7–15.4)
WBC: 2.9 10*3/uL — ABNORMAL LOW (ref 3.4–10.8)

## 2019-03-16 DIAGNOSIS — R200 Anesthesia of skin: Secondary | ICD-10-CM | POA: Diagnosis not present

## 2019-03-31 DIAGNOSIS — R200 Anesthesia of skin: Secondary | ICD-10-CM | POA: Diagnosis not present

## 2019-04-24 DIAGNOSIS — L816 Other disorders of diminished melanin formation: Secondary | ICD-10-CM | POA: Diagnosis not present

## 2019-04-30 ENCOUNTER — Encounter: Payer: Self-pay | Admitting: Gastroenterology

## 2019-04-30 ENCOUNTER — Other Ambulatory Visit: Payer: Self-pay

## 2019-04-30 ENCOUNTER — Ambulatory Visit: Payer: BC Managed Care – PPO | Admitting: Gastroenterology

## 2019-04-30 VITALS — BP 104/61 | HR 66 | Temp 98.4°F | Wt 146.2 lb

## 2019-04-30 DIAGNOSIS — K625 Hemorrhage of anus and rectum: Secondary | ICD-10-CM

## 2019-04-30 MED ORDER — NA SULFATE-K SULFATE-MG SULF 17.5-3.13-1.6 GM/177ML PO SOLN: 354.0000 mL | Freq: Once | ORAL | 0 refills | Status: AC

## 2019-04-30 MED ORDER — BISACODYL EC 5 MG PO TBEC: DELAYED_RELEASE_TABLET | ORAL | 0 refills | Status: DC

## 2019-04-30 NOTE — Progress Notes (Signed)
[name], MD [address]  [place]  [place], [place] [address]  Main: [phone]  Fax: [phone]   Primary Care Physician: Virginia Crews, MD   Chief Complaint  Patient presents with  . Blood In Stools    no symptoms   . Gastroesophageal Reflux    no symptoms     HPI: Jennifer Hanna is a 33 y.o. female here for follow-up of intermittent rectal bleeding.  Continuing to have intermittent symptoms of bleeding.  Last episode was 1 week ago.  Small amount of streak in stool.  Was taking MiraLAX which helped some and stopped taking it about a week or 2 ago.  Has started using more fiber in her diet which has helped as well.  No melena.  No dysphagia or reflux.  No weight loss.  No family history of colon cancer.  Past Medical History:  Diagnosis Date  . Tremors of nervous system     Current Outpatient Medications  Medication Sig Dispense Refill  . Cholecalciferol (VITAMIN D3 PO) Take by mouth.    . Multiple Vitamin (MULTIVITAMIN) tablet Take 1 tablet by mouth daily.    . bisacodyl (BISACODYL) 5 MG EC tablet Take 2 tablets (10mg ) by mouth the day before your procedure between 1pm-3pm. 2 tablet 0  . Na Sulfate-K Sulfate-Mg Sulf 17.5-3.13-1.6 GM/177ML SOLN Take 354 mLs by mouth once for 1 dose. 354 mL 0   No current facility-administered medications for this visit.    Allergies as of 04/30/2019  . (No Known Allergies)    ROS:  General: Negative for anorexia, weight loss, fever, chills, fatigue, weakness. ENT: Negative for hoarseness, difficulty swallowing , nasal congestion. CV: Negative for chest pain, angina, palpitations, dyspnea on exertion, peripheral edema.  Respiratory: Negative for dyspnea at rest, dyspnea on exertion, cough, sputum, wheezing.  GI: See history of present illness. GU:  Negative for dysuria, hematuria, urinary incontinence, urinary frequency, nocturnal urination.  Endo: Negative for unusual weight change.    Physical  Examination:   BP 104/61 (BP Location: Left Arm, Patient Position: Sitting, Cuff Size: Normal)   Pulse 66   Temp 98.4 F (36.9 C) (Oral)   Wt 146 lb 4 oz (66.3 kg)   BMI 22.24 kg/m   General: Well-nourished, well-developed in no acute distress.  Eyes: No icterus. Conjunctivae pink. Mouth: Oropharyngeal mucosa moist and pink , no lesions erythema or exudate. Neck: Supple, Trachea midline Abdomen: Bowel sounds are normal, nontender, nondistended, no hepatosplenomegaly or masses, no abdominal bruits or hernia , no rebound or guarding.   Extremities: No lower extremity edema. No clubbing or deformities. Neuro: Alert and oriented x 3.  Grossly intact. Skin: Warm and dry, no jaundice.   Psych: Alert and cooperative, normal mood and affect.   Labs: CMP     Component Value Date/Time   NA 140 05/02/2018 0945   K 4.1 05/02/2018 0945   CL 104 05/02/2018 0945   CO2 22 05/02/2018 0945   GLUCOSE 80 05/02/2018 0945   BUN 13 05/02/2018 0945   CREATININE 0.94 05/02/2018 0945   CALCIUM 9.2 05/02/2018 0945   PROT 7.0 05/02/2018 0945   ALBUMIN 4.1 05/02/2018 0945   AST 21 05/02/2018 0945   ALT 18 05/02/2018 0945   ALKPHOS 37 (L) 05/02/2018 0945   BILITOT 0.7 05/02/2018 0945   GFRNONAA 80 05/02/2018 0945   GFRAA 92 05/02/2018 0945   Lab Results  Component Value Date   WBC 2.9 (L) 03/10/2019  HGB 12.9 03/10/2019   HCT 38.8 03/10/2019   MCV 94 03/10/2019   PLT 267 03/10/2019    Imaging Studies: No results found.  Assessment and Plan:   [name] is a 33 y.o. y/o female here for follow-up of intermittent bright red per rectum  Given ongoing symptoms despite MiraLAX and high-fiber diet, patient is now interested in a colonoscopy  We will schedule to rule out malignancy  Rectal exam on last visit did not show any external lesions or masses in rectal vault.  Rectal exam deferred deferred today  I have discussed alternative options, risks & benefits,  which include, but  are not limited to, bleeding, infection, perforation,respiratory complication & drug reaction.  The patient agrees with this plan & written consent will be obtained.    Continue high-fiber diet    Dr [name]

## 2019-05-05 ENCOUNTER — Encounter: Payer: BLUE CROSS/BLUE SHIELD | Admitting: Family Medicine

## 2019-05-28 ENCOUNTER — Other Ambulatory Visit
Admission: RE | Admit: 2019-05-28 | Discharge: 2019-05-28 | Disposition: A | Discharge status: Home / Self Care | Payer: BC Managed Care – PPO | Source: Ambulatory Visit | Attending: Gastroenterology | Admitting: Gastroenterology

## 2019-05-28 ENCOUNTER — Other Ambulatory Visit: Payer: Self-pay

## 2019-05-28 DIAGNOSIS — Z20822 Contact with and (suspected) exposure to covid-19: Secondary | ICD-10-CM | POA: Diagnosis not present

## 2019-05-28 DIAGNOSIS — Z01812 Encounter for preprocedural laboratory examination: Secondary | ICD-10-CM | POA: Insufficient documentation

## 2019-05-29 ENCOUNTER — Telehealth: Payer: Self-pay

## 2019-05-29 LAB — SARS CORONAVIRUS 2 (TAT 6-24 HRS): SARS Coronavirus 2: NEGATIVE

## 2019-05-29 NOTE — Telephone Encounter (Signed)
Patient states she has a procedure on 06/01/2019 and wants to moved the procedure to 06/18/2019. Patient will Covid test on 06/16/2019. Sent patient new instructions to mychart and called endo and moved patient with Elta Gu[place]. Patient is being moved because she tested positive at school for [employer] but tested negative with [place]. We are changing to be sure she is negative

## 2019-06-01 ENCOUNTER — Telehealth: Payer: Self-pay

## 2019-06-01 NOTE — Telephone Encounter (Signed)
Called and left a message for patient to give [place] a call back to rescheduled patient appointment on 06/18/2019

## 2019-06-01 NOTE — Telephone Encounter (Signed)
Informed patient that we can  Not do procedure after 06/12/2019. Informed patient that we would call her to rescheduled this procedure when  We know our [place] Scheduled

## 2019-06-03 ENCOUNTER — Telehealth: Payer: Self-pay

## 2019-06-03 NOTE — Telephone Encounter (Signed)
Offered patient dr. Vicente Males days in [facility] right now for procedures. Patient will check her scheduled and give [place] a call back

## 2019-06-09 ENCOUNTER — Other Ambulatory Visit: Payer: Self-pay

## 2019-06-09 ENCOUNTER — Telehealth: Payer: Self-pay | Admitting: Gastroenterology

## 2019-06-09 ENCOUNTER — Telehealth: Payer: Self-pay

## 2019-06-09 DIAGNOSIS — K625 Hemorrhage of anus and rectum: Secondary | ICD-10-CM

## 2019-06-09 NOTE — Telephone Encounter (Signed)
Pt left vm returning your call 

## 2019-06-09 NOTE — Telephone Encounter (Signed)
Called and left a message for call back to rescheduled patient colonoscopy. Sent patient a Pharmacist, community message with some days.

## 2019-06-09 NOTE — Telephone Encounter (Signed)
Patient would like to have procedure done on 08/18/2019

## 2019-06-11 ENCOUNTER — Encounter: Payer: BC Managed Care – PPO | Admitting: Family Medicine

## 2019-06-18 ENCOUNTER — Encounter: Admission: RE | Payer: Self-pay | Source: Home / Self Care

## 2019-06-18 ENCOUNTER — Ambulatory Visit
Admission: RE | Admit: 2019-06-18 | Payer: BC Managed Care – PPO | Source: Home / Self Care | Admitting: Gastroenterology

## 2019-06-18 SURGERY — COLONOSCOPY WITH PROPOFOL
Anesthesia: General

## 2019-07-20 ENCOUNTER — Other Ambulatory Visit (HOSPITAL_COMMUNITY)
Admission: RE | Admit: 2019-07-20 | Discharge: 2019-07-20 | Disposition: A | Discharge status: Home / Self Care | Payer: BC Managed Care – PPO | Source: Ambulatory Visit | Attending: Certified Nurse Midwife | Admitting: Certified Nurse Midwife

## 2019-07-20 ENCOUNTER — Encounter: Payer: BLUE CROSS/BLUE SHIELD | Admitting: Certified Nurse Midwife

## 2019-07-20 ENCOUNTER — Other Ambulatory Visit: Payer: Self-pay

## 2019-07-20 ENCOUNTER — Encounter: Payer: Self-pay | Admitting: Certified Nurse Midwife

## 2019-07-20 ENCOUNTER — Ambulatory Visit (INDEPENDENT_AMBULATORY_CARE_PROVIDER_SITE_OTHER): Payer: BC Managed Care – PPO | Admitting: Certified Nurse Midwife

## 2019-07-20 VITALS — BP 101/55 | HR 77 | Ht 68.0 in | Wt 145.2 lb

## 2019-07-20 DIAGNOSIS — Z01419 Encounter for gynecological examination (general) (routine) without abnormal findings: Secondary | ICD-10-CM

## 2019-07-20 DIAGNOSIS — Z113 Encounter for screening for infections with a predominantly sexual mode of transmission: Secondary | ICD-10-CM | POA: Diagnosis not present

## 2019-07-20 DIAGNOSIS — R102 Pelvic and perineal pain: Secondary | ICD-10-CM | POA: Insufficient documentation

## 2019-07-20 NOTE — Progress Notes (Signed)
ANNUAL PREVENTATIVE CARE GYN  ENCOUNTER NOTE  Subjective:       Jennifer Hanna is a 34 y.o. G0P0000 female here for a routine annual gynecologic exam.  Current complaints: 1. Single, short episode of pelvic pain approximately three (3) months ago, feeling fine now  Denies difficulty breathing or respiratory distress, chest pain, abdominal pain, dysuria, and leg pain or swelling.    Gynecologic History  Patient's last menstrual period was 06/24/2019 (exact date). Period Cycle (Days): 28 Period Duration (Days): 7 Period Pattern: Regular Menstrual Flow: Moderate Menstrual Control: Maxi pad, Thin pad Dysmenorrhea: None  Contraception: condoms  Last Pap: 04/2017. Results were: Neg/Neg  Obstetric History  OB History  Gravida Para Term Preterm AB Living  0 0 0 0 0 0  SAB TAB Ectopic Multiple Live Births  0 0 0 0 0    Past Medical History:  Diagnosis Date  . Tremors of nervous system     Past Surgical History:  Procedure Laterality Date  . KNEE ARTHROSCOPY W/ ACL RECONSTRUCTION Left 2007    Current Outpatient Medications on File Prior to Visit  Medication Sig Dispense Refill  . Multiple Vitamin (MULTIVITAMIN) tablet Take 1 tablet by mouth daily.     No current facility-administered medications on file prior to visit.    No Known Allergies  Social History   Socioeconomic History  . Marital status: Single    Spouse name: Not on file  . Number of children: 0  . Years of education: Not on file  . Highest education level: Not on file  Occupational History  . Occupation: Biochemist, clinical  Tobacco Use  . Smoking status: Never Smoker  . Smokeless tobacco: Never Used  Substance and Sexual Activity  . Alcohol use: Yes    Comment: occasional   . Drug use: No  . Sexual activity: Yes    Partners: Male    Birth control/protection: Condom  Other Topics Concern  . Not on file  Social History Narrative  . Not on file   Social Determinants of Health   Financial  Resource Strain:   . Difficulty of Paying Living Expenses: Not on file  Food Insecurity:   . Worried About Charity fundraiser in the Last Year: Not on file  . Ran Out of Food in the Last Year: Not on file  Transportation Needs:   . Lack of Transportation (Medical): Not on file  . Lack of Transportation (Non-Medical): Not on file  Physical Activity:   . Days of Exercise per Week: Not on file  . Minutes of Exercise per Session: Not on file  Stress:   . Feeling of Stress : Not on file  Social Connections:   . Frequency of Communication with Friends and Family: Not on file  . Frequency of Social Gatherings with Friends and Family: Not on file  . Attends Religious Services: Not on file  . Active Member of Clubs or Organizations: Not on file  . Attends Archivist Meetings: Not on file  . Marital Status: Not on file  Intimate Partner Violence:   . Fear of Current or Ex-Partner: Not on file  . Emotionally Abused: Not on file  . Physically Abused: Not on file  . Sexually Abused: Not on file    Family History  Problem Relation Age of Onset  . Hypertension Father   . Breast cancer Mother 79  . Hypertension Sister   . Healthy Brother   . Colon cancer Neg Hx   .  Ovarian cancer Neg Hx     The following portions of the patient's history were reviewed and updated as appropriate: allergies, current medications, past family history, past medical history, past social history, past surgical history and problem list.  Review of Systems  ROS negative except as noted above. Information obtained from patient.     Objective:   BP (!) 101/55   Pulse 77   Ht 5\' 8"  (1.727 m)   Wt 145 lb 3.2 oz (65.9 kg)   LMP 06/24/2019 (Exact Date)   BMI 22.08 kg/m    CONSTITUTIONAL: Well-developed, well-nourished female in no acute distress.   PSYCHIATRIC: Normal mood and affect. Normal behavior. Normal judgment and thought content.  [place]: Alert and oriented to person, place, and time.  Normal muscle tone coordination. No cranial nerve deficit noted.  HENT:  Normocephalic, atraumatic, External right and left ear normal.  EYES: Conjunctivae and EOM are normal. Pupils are equal and round.   NECK: Normal range of motion, supple, no masses.  Normal thyroid.   SKIN: Skin is warm and dry. No rash noted. Not diaphoretic. No erythema. No pallor.  CARDIOVASCULAR: Normal heart rate noted, regular rhythm, no murmur.  RESPIRATORY: Clear to auscultation bilaterally. Effort and breath sounds normal, no problems with respiration noted.  BREASTS: Symmetric in size. No masses, skin changes, nipple drainage, or lymphadenopathy.  ABDOMEN: Soft, normal bowel sounds, no distention noted.  No tenderness, rebound or guarding.   PELVIC:  External Genitalia: Normal  Vagina: Normal  Cervix: Normal  Uterus: Normal  Adnexa: Normal   MUSCULOSKELETAL: Normal range of motion. No tenderness.  No cyanosis, clubbing, or edema.  2+ distal pulses.  LYMPHATIC: No Axillary, Supraclavicular, or Inguinal Adenopathy.  Assessment:   Annual gynecologic examination 34 y.o.   Contraception: condoms   Normal BMI   Problem List Items Addressed This Visit    None    Visit Diagnoses    Well woman exam    -  Primary   Relevant Orders   CBC   RPR   HIV Antibody (routine testing w rflx)   Cervicovaginal ancillary only   Pelvic pain       Relevant Orders   Cervicovaginal ancillary only   Screen for STD (sexually transmitted disease)       Relevant Orders   RPR   HIV Antibody (routine testing w rflx)   Cervicovaginal ancillary only      Plan:   Pap: Not needed  Labs: See orders   Routine preventative health maintenance measures emphasized: Exercise/Diet/Weight control, Tobacco Warnings, Alcohol/Substance use risks and Stress Management; see AVS  Reviewed red flag symptoms and when to call  RTC x 1 year for ANNUAL EXAM or sooner if needed    Diona Fanti, CNM [facility] 07/20/19 8:50 AM

## 2019-07-20 NOTE — Progress Notes (Signed)
Patient here for annual exam.  Patient c/o pelvic pain 3 months ago, feels fine now.

## 2019-07-20 NOTE — Patient Instructions (Addendum)
Pelvic Pain, Female Pelvic pain is pain in your lower belly (abdomen), below your belly button and between your hips. The pain may start suddenly (be acute), keep coming back (be recurring), or last a long time (become chronic). Pelvic pain that lasts longer than 6 months is called chronic pelvic pain. There are many causes of pelvic pain. Sometimes the cause of pelvic pain is not known. Follow these instructions at home:   Take over-the-counter and prescription medicines only as told by your doctor.  Rest as told by your doctor.  Do not have sex if it hurts.  Keep a journal of your pelvic pain. Write down: ? When the pain started. ? Where the pain is located. ? What seems to make the pain better or worse, such as food or your period (menstrual cycle). ? Any symptoms you have along with the pain.  Keep all follow-up visits as told by your doctor. This is important. Contact a doctor if:  Medicine does not help your pain.  Your pain comes back.  You have new symptoms.  You have unusual discharge or bleeding from your vagina.  You have a fever or chills.  You are having trouble pooping (constipation).  You have blood in your pee (urine) or poop (stool).  Your pee smells bad.  You feel weak or light-headed. Get help right away if:  You have sudden pain that is very bad.  Your pain keeps getting worse.  You have very bad pain and also have any of these symptoms: ? A fever. ? Feeling sick to your stomach (nausea). ? Throwing up (vomiting). ? Being very sweaty.  You pass out (lose consciousness). Summary  Pelvic pain is pain in your lower belly (abdomen), below your belly button and between your hips.  There are many possible causes of pelvic pain.  Keep a journal of your pelvic pain. This information is not intended to replace advice given to you by your health care provider. Make sure you discuss any questions you have with your health care provider. Document  Revised: 10/23/2017 Document Reviewed: 10/23/2017 Elsevier Patient Education  2020 [place] is pain in the belly (abdomen) that affects women. The pain happens between periods. The pain:  Affects one side of the belly. The side may change from month to month.  May be mild or very bad.  May last from minutes to hours. It does not last longer than 1-2 days.  Can happen with a feeling of being sick to your stomach (nausea).  Can happen with a small amount of bleeding from your vagina. [place]merz is caused by the growth and release of an egg from an ovary. It is a natural part of the ovulation cycle. It often happens about 2 weeks after a woman's period ends. Follow these instructions at home: Watch for any changes in your condition. Let your doctor know about them. Take these actions to help with your pain:  Try soaking in a hot bath.  Try a heating pad to relax the muscles in the belly.  Take over-the-counter and prescription medicines only as told by your doctor.  Keep all follow-up visits as told by your doctor. This is important. Contact a doctor if:  The pain is very bad most months.  The pain lasts longer than 24 hours.  Your pain medicine is not helping.  You have a fever.  You feel sick to your stomach, and the feeling does not go away.  You keep throwing  up (vomiting).  You miss your period.  You have more than a small amount of blood coming from your vagina between periods. Summary  Mittelschmerz is a condition that affects women. It is pain in the belly that happens between periods.  This condition is caused by the growth and release of an egg from an ovary.  The pain may affect one side of the belly, may be mild or very bad, or may cause you to feel sick to your stomach.  To help with your pain, soak in a hot bath, use a heating pad, or take medicines.  Watch for any changes in your condition. Know when to contact a  doctor for help. This information is not intended to replace advice given to you by your health care provider. Make sure you discuss any questions you have with your health care provider. Document Revised: 11/19/2017 Document Reviewed: 11/19/2017 Elsevier Patient Education  [place].

## 2019-07-21 LAB — CBC
Hematocrit: 38.5 % (ref 34.0–46.6)
Hemoglobin: 13.1 g/dL (ref 11.1–15.9)
MCH: 32 pg (ref 26.6–33.0)
MCHC: 34 g/dL (ref 31.5–35.7)
MCV: 94 fL (ref 79–97)
Platelets: 228 10*3/uL (ref 150–450)
RBC: 4.1 x10E6/uL (ref 3.77–5.28)
RDW: 12.6 % (ref 11.7–15.4)
WBC: 3.6 10*3/uL (ref 3.4–10.8)

## 2019-07-21 LAB — CERVICOVAGINAL ANCILLARY ONLY
Bacterial Vaginitis (gardnerella): NEGATIVE
Candida Glabrata: NEGATIVE
Candida Vaginitis: NEGATIVE
Chlamydia: NEGATIVE
Comment: NEGATIVE
Comment: NEGATIVE
Comment: NEGATIVE
Comment: NEGATIVE
Comment: NEGATIVE
Comment: NORMAL
Neisseria Gonorrhea: NEGATIVE
Trichomonas: NEGATIVE

## 2019-07-21 LAB — RPR: RPR Ser Ql: NONREACTIVE

## 2019-07-21 LAB — HIV ANTIBODY (ROUTINE TESTING W REFLEX): HIV Screen 4th Generation wRfx: NONREACTIVE

## 2019-08-11 ENCOUNTER — Encounter: Payer: Self-pay | Admitting: Gastroenterology

## 2019-08-11 ENCOUNTER — Other Ambulatory Visit: Payer: Self-pay

## 2019-08-13 ENCOUNTER — Other Ambulatory Visit: Payer: Self-pay

## 2019-08-13 ENCOUNTER — Ambulatory Visit: Payer: BC Managed Care – PPO | Admitting: Certified Nurse Midwife

## 2019-08-13 ENCOUNTER — Other Ambulatory Visit (INDEPENDENT_AMBULATORY_CARE_PROVIDER_SITE_OTHER): Payer: BC Managed Care – PPO

## 2019-08-13 ENCOUNTER — Encounter: Payer: Self-pay | Admitting: Certified Nurse Midwife

## 2019-08-13 VITALS — BP 102/62 | HR 71 | Ht 68.0 in | Wt 143.0 lb

## 2019-08-13 DIAGNOSIS — D252 Subserosal leiomyoma of uterus: Secondary | ICD-10-CM | POA: Insufficient documentation

## 2019-08-13 DIAGNOSIS — R102 Pelvic and perineal pain: Secondary | ICD-10-CM | POA: Diagnosis not present

## 2019-08-13 NOTE — Progress Notes (Signed)
GYN ENCOUNTER NOTE  Subjective:       Jennifer Hanna is a 34 y.o. G0P0000 female is here for gynecologic evaluation of the following issues:  1. Left sided sharp, irregular pelvic pain that worsens after menses for the last few months; worse today, unable to take anything for pain due to colonoscopy scheduled on Tuesday.   Denies difficulty breathing or respiratory distress, chest pain, dysuria, and leg pain or swelling.    Gynecologic History  Patient's last menstrual period was 07/22/2019 (exact date).  Contraception: condoms  Last Pap: Up to date  Obstetric History  OB History  Gravida Para Term Preterm AB Living  0 0 0 0 0 0  SAB TAB Ectopic Multiple Live Births  0 0 0 0 0    Past Medical History:  Diagnosis Date  . COVID-19    Tested (+) in 05/2019.  Had (-) test right after.  . Tremors of nervous system     Past Surgical History:  Procedure Laterality Date  . KNEE ARTHROSCOPY W/ ACL RECONSTRUCTION Left 2007    Current Outpatient Medications on File Prior to Visit  Medication Sig Dispense Refill  . Ascorbic Acid (VITAMIN C PO) Take by mouth.    . Cholecalciferol (VITAMIN D3 PO) Take by mouth.    . Multiple Vitamin (MULTIVITAMIN) tablet Take 1 tablet by mouth daily.     No current facility-administered medications on file prior to visit.    No Known Allergies  Social History   Socioeconomic History  . Marital status: Single    Spouse name: Not on file  . Number of children: 0  . Years of education: Not on file  . Highest education level: Not on file  Occupational History  . Occupation: Biochemist, clinical  Tobacco Use  . Smoking status: Never Smoker  . Smokeless tobacco: Never Used  Substance and Sexual Activity  . Alcohol use: Yes    Alcohol/week: 3.0 standard drinks    Types: 3 Glasses of wine per week    Comment: occasional   . Drug use: No  . Sexual activity: Yes    Partners: Male    Birth control/protection: Condom  Other Topics Concern   . Not on file  Social History Narrative  . Not on file   Social Determinants of Health   Financial Resource Strain:   . Difficulty of Paying Living Expenses:   Food Insecurity:   . Worried About Charity fundraiser in the Last Year:   . Arboriculturist in the Last Year:   Transportation Needs:   . Film/video editor (Medical):   Marland Kitchen Lack of Transportation (Non-Medical):   Physical Activity:   . Days of Exercise per Week:   . Minutes of Exercise per Session:   Stress:   . Feeling of Stress :   Social Connections:   . Frequency of Communication with Friends and Family:   . Frequency of Social Gatherings with Friends and Family:   . Attends Religious Services:   . Active Member of Clubs or Organizations:   . Attends Archivist Meetings:   Marland Kitchen Marital Status:   Intimate Partner Violence:   . Fear of Current or Ex-Partner:   . Emotionally Abused:   Marland Kitchen Physically Abused:   . Sexually Abused:     Family History  Problem Relation Age of Onset  . Hypertension Father   . Breast cancer Mother 11  . Hypertension Sister   . Healthy Brother   .  Colon cancer Neg Hx   . Ovarian cancer Neg Hx     The following portions of the patient's history were reviewed and updated as appropriate: allergies, current medications, past family history, past medical history, past social history, past surgical history and problem list.  Review of Systems  ROS negative except as noted above. Information obtained from patient.   Objective:   BP 102/62   Pulse 71   Ht 5\' 8"  (1.727 m)   Wt 143 lb (64.9 kg)   LMP 07/22/2019 (Exact Date)   BMI 21.74 kg/m   CONSTITUTIONAL: Well-developed, well-nourished female in no acute distress.   ABDOMEN: Soft, non distended; left lower quadrant tenderness with palpation.    MUSCULOSKELETAL: Normal range of motion. No tenderness.  No cyanosis, clubbing, or edema.  ULTRASOUND REPORT Location: [facility] Date of Service: 08/13/2019    Indications:Pelvic Pain Findings:  The uterus is anteverted and measures 9.1 x 8.3x 7.5 cm. Echo texture is heterogenous with evidence of focal masses. Within the uterus are multiple suspected fibroids measuring: Fibroid 1:Post SS 5.3 x 4.1x 4.9 cm Fibroid 2:Fundal SS 3.4 x 3.1 x 3.2 cm Fibroid 3: Fundal SS 3.2 x 3.3 x 3.7 cm Fibroid 4 : Ant SS 2.8 x 1.9 x 2.4 cm  The Endometrium measures 8 mm.  Right Ovary measures 3.0 x 2.4 x 2.3 cm. It is normal in appearance. Left Ovary measures 4.3 x 2.8 x 3.2 cm. It is normal in appearance. Survey of the adnexa demonstrates no adnexal masses. There is no free fluid in the cul de sac.  Impression: 1. Enlarge fibroid uterus. 2. Survey of the adnexa demonstrates no adnexal masses  Recommendations: 1.Clinical correlation with the patient's History and Physical Exam.   Assessment:   1. Pelvic pain  - US PELVIS TRANSVAGINAL NON-OB (TV ONLY); Future - US PELVIS (TRANSABDOMINAL ONLY); Future - Urine Culture  2. Subserous leiomyoma of uterus   Plan:   Ultrasound findings reviewed with patient, verbalized understanding.   Handouts given regarding treatment options.   Patient to contact provider with desired treatment, questions or concerns.   Reviewed red flag symptoms and when to call.   RTC x 6 months for follow up ultrasound or sooner if needed.    Diona Fanti, CNM [facility], [facility] 08/13/19 5:37 PM

## 2019-08-13 NOTE — Patient Instructions (Signed)

## 2019-08-14 ENCOUNTER — Other Ambulatory Visit
Admission: RE | Admit: 2019-08-14 | Discharge: 2019-08-14 | Disposition: A | Discharge status: Home / Self Care | Payer: BC Managed Care – PPO | Source: Ambulatory Visit | Attending: Gastroenterology | Admitting: Gastroenterology

## 2019-08-14 DIAGNOSIS — Z20822 Contact with and (suspected) exposure to covid-19: Secondary | ICD-10-CM | POA: Insufficient documentation

## 2019-08-14 DIAGNOSIS — Z01812 Encounter for preprocedural laboratory examination: Secondary | ICD-10-CM | POA: Insufficient documentation

## 2019-08-15 LAB — SARS CORONAVIRUS 2 (TAT 6-24 HRS): SARS Coronavirus 2: NEGATIVE

## 2019-08-17 NOTE — Discharge Instructions (Signed)
General Anesthesia, Adult, Care After This sheet gives you information about how to care for yourself after your procedure. Your health care provider may also give you more specific instructions. If you have problems or questions, contact your health care provider. What can I expect after the procedure? After the procedure, the following side effects are common:  Pain or discomfort at the IV site.  Nausea.  Vomiting.  Sore throat.  Trouble concentrating.  Feeling cold or chills.  Weak or tired.  Sleepiness and fatigue.  Soreness and body aches. These side effects can affect parts of the body that were not involved in surgery. Follow these instructions at home:  For at least 24 hours after the procedure:  Have a responsible adult stay with you. It is important to have someone help care for you until you are awake and alert.  Rest as needed.  Do not: ? Participate in activities in which you could fall or become injured. ? Drive. ? Use heavy machinery. ? Drink alcohol. ? Take sleeping pills or medicines that cause drowsiness. ? Make important decisions or sign legal documents. ? Take care of children on your own. Eating and drinking  Follow any instructions from your health care provider about eating or drinking restrictions.  When you feel hungry, start by eating small amounts of foods that are soft and easy to digest (bland), such as toast. Gradually return to your regular diet.  Drink enough fluid to keep your urine pale yellow.  If you vomit, rehydrate by drinking water, juice, or clear broth. General instructions  If you have sleep apnea, surgery and certain medicines can increase your risk for breathing problems. Follow instructions from your health care provider about wearing your sleep device: ? Anytime you are sleeping, including during daytime naps. ? While taking prescription pain medicines, sleeping medicines, or medicines that make you drowsy.  Return to  your normal activities as told by your health care provider. Ask your health care provider what activities are safe for you.  Take over-the-counter and prescription medicines only as told by your health care provider.  If you smoke, do not smoke without supervision.  Keep all follow-up visits as told by your health care provider. This is important. Contact a health care provider if:  You have nausea or vomiting that does not get better with medicine.  You cannot eat or drink without vomiting.  You have pain that does not get better with medicine.  You are unable to pass urine.  You develop a skin rash.  You have a fever.  You have redness around your IV site that gets worse. Get help right away if:  You have difficulty breathing.  You have chest pain.  You have blood in your urine or stool, or you vomit blood. Summary  After the procedure, it is common to have a sore throat or nausea. It is also common to feel tired.  Have a responsible adult stay with you for the first 24 hours after general anesthesia. It is important to have someone help care for you until you are awake and alert.  When you feel hungry, start by eating small amounts of foods that are soft and easy to digest (bland), such as toast. Gradually return to your regular diet.  Drink enough fluid to keep your urine pale yellow.  Return to your normal activities as told by your health care provider. Ask your health care provider what activities are safe for you. This information is not   intended to replace advice given to you by your health care provider. Make sure you discuss any questions you have with your health care provider. Document Revised: 05/10/2017 Document Reviewed: 12/21/2016 Elsevier Patient Education  2020 Elsevier Inc[employer]  

## 2019-08-17 NOTE — Anesthesia Preprocedure Evaluation (Addendum)
Anesthesia Evaluation  Patient identified by MRN, date of birth, ID band Patient awake    Reviewed: Allergy & Precautions, NPO status , Patient's Chart, lab work & pertinent test results  History of Anesthesia Complications Negative for: history of anesthetic complications  Airway Mallampati: II  TM Distance: >3 FB Neck ROM: Full    Dental   Pulmonary   COVID+ 05/2019`   breath sounds clear to auscultation       Cardiovascular (-) angina(-) DOE  Rhythm:Regular Rate:Normal     Neuro/Psych  Tremors    GI/Hepatic GERD  Controlled,  Endo/Other    Renal/GU      Musculoskeletal   Abdominal   Peds  Hematology   Anesthesia Other Findings   Reproductive/Obstetrics                            Anesthesia Physical Anesthesia Plan  ASA: I  Anesthesia Plan: General   Post-op Pain Management:    Induction: Intravenous  PONV Risk Score and Plan: 3 and Propofol infusion, TIVA and Treatment may vary due to age or medical condition  Airway Management Planned: Natural Airway and Nasal Cannula  Additional Equipment:   Intra-op Plan:   Post-operative Plan:   Informed Consent: I have reviewed the patients History and Physical, chart, labs and discussed the procedure including the risks, benefits and alternatives for the proposed anesthesia with the patient or authorized representative who has indicated his/her understanding and acceptance.       Plan Discussed with: CRNA and Anesthesiologist  Anesthesia Plan Comments:        Anesthesia Quick Evaluation

## 2019-08-18 ENCOUNTER — Other Ambulatory Visit: Payer: Self-pay

## 2019-08-18 ENCOUNTER — Ambulatory Visit
Admission: RE | Admit: 2019-08-18 | Discharge: 2019-08-18 | Disposition: A | Discharge status: Home / Self Care | Payer: BC Managed Care – PPO | Attending: Gastroenterology | Admitting: Gastroenterology

## 2019-08-18 ENCOUNTER — Encounter
Admission: RE | Disposition: A | Discharge status: Home / Self Care | Payer: Self-pay | Source: Home / Self Care | Attending: Gastroenterology

## 2019-08-18 ENCOUNTER — Encounter: Payer: Self-pay | Admitting: Gastroenterology

## 2019-08-18 ENCOUNTER — Ambulatory Visit: Payer: BC Managed Care – PPO | Admitting: Anesthesiology

## 2019-08-18 DIAGNOSIS — K625 Hemorrhage of anus and rectum: Secondary | ICD-10-CM

## 2019-08-18 DIAGNOSIS — Z8616 Personal history of COVID-19: Secondary | ICD-10-CM | POA: Diagnosis not present

## 2019-08-18 DIAGNOSIS — Z803 Family history of malignant neoplasm of breast: Secondary | ICD-10-CM | POA: Diagnosis not present

## 2019-08-18 DIAGNOSIS — Z8249 Family history of ischemic heart disease and other diseases of the circulatory system: Secondary | ICD-10-CM | POA: Diagnosis not present

## 2019-08-18 DIAGNOSIS — Z79899 Other long term (current) drug therapy: Secondary | ICD-10-CM | POA: Insufficient documentation

## 2019-08-18 DIAGNOSIS — R251 Tremor, unspecified: Secondary | ICD-10-CM | POA: Diagnosis not present

## 2019-08-18 HISTORY — DX: COVID-19: U07.1

## 2019-08-18 HISTORY — PX: COLONOSCOPY WITH PROPOFOL: SHX5780

## 2019-08-18 LAB — POCT PREGNANCY, URINE: Preg Test, Ur: NEGATIVE

## 2019-08-18 SURGERY — COLONOSCOPY WITH PROPOFOL
Anesthesia: General

## 2019-08-18 MED ORDER — LACTATED RINGERS IV SOLN
100.0000 mL/h | INTRAVENOUS | Status: DC
  Administered 2019-08-18: 100 mL/h via INTRAVENOUS

## 2019-08-18 MED ORDER — ONDANSETRON HCL 4 MG/2ML IJ SOLN: 4.0000 mg | Freq: Once | INTRAMUSCULAR | Status: DC | PRN

## 2019-08-18 MED ORDER — SODIUM CHLORIDE 0.9 % IV SOLN: INTRAVENOUS | Status: DC

## 2019-08-18 MED ORDER — PROPOFOL 10 MG/ML IV BOLUS
INTRAVENOUS | Status: DC | PRN
  Administered 2019-08-18: 100 mg via INTRAVENOUS
  Administered 2019-08-18: 50 mg via INTRAVENOUS

## 2019-08-18 MED ORDER — ACETAMINOPHEN 10 MG/ML IV SOLN: 1000.0000 mg | Freq: Once | INTRAVENOUS | Status: DC | PRN

## 2019-08-18 MED ORDER — STERILE WATER FOR IRRIGATION IR SOLN
Status: DC | PRN
  Administered 2019-08-18: 300 mL

## 2019-08-18 MED ORDER — LIDOCAINE HCL (CARDIAC) PF 100 MG/5ML IV SOSY
PREFILLED_SYRINGE | INTRAVENOUS | Status: DC | PRN
  Administered 2019-08-18: 50 mg via INTRAVENOUS

## 2019-08-18 SURGICAL SUPPLY — 25 items
CANISTER SUCT 1200ML W/VALVE (MISCELLANEOUS) ×2 IMPLANT
CLIP HMST 235XBRD CATH ROT (MISCELLANEOUS) IMPLANT
CLIP RESOLUTION 360 11X235 (MISCELLANEOUS)
ELECT REM PT RETURN 9FT ADLT (ELECTROSURGICAL)
ELECTRODE REM PT RTRN 9FT ADLT (ELECTROSURGICAL) IMPLANT
FCP ESCP3.2XJMB 240X2.8X (MISCELLANEOUS)
FORCEPS BIOP RAD 4 LRG CAP 4 (CUTTING FORCEPS) IMPLANT
FORCEPS BIOP RJ4 240 W/NDL (MISCELLANEOUS)
FORCEPS ESCP3.2XJMB 240X2.8X (MISCELLANEOUS) IMPLANT
GOWN CVR UNV OPN BCK APRN NK (MISCELLANEOUS) ×2 IMPLANT
GOWN ISOL THUMB LOOP REG UNIV (MISCELLANEOUS) ×2
INJECTOR VARIJECT VIN23 (MISCELLANEOUS) IMPLANT
KIT DEFENDO VALVE AND CONN (KITS) IMPLANT
KIT ENDO PROCEDURE OLY (KITS) ×2 IMPLANT
MARKER SPOT ENDO TATTOO 5ML (MISCELLANEOUS) IMPLANT
PROBE APC STR FIRE (PROBE) IMPLANT
RETRIEVER NET ROTH 2.5X230 LF (MISCELLANEOUS) IMPLANT
SNARE COLD EXACTO (MISCELLANEOUS) IMPLANT
SNARE SHORT THROW 13M SML OVAL (MISCELLANEOUS) IMPLANT
SNARE SHORT THROW 30M LRG OVAL (MISCELLANEOUS) IMPLANT
SNARE SNG USE RND 15MM (INSTRUMENTS) IMPLANT
SPOT EX ENDOSCOPIC TATTOO (MISCELLANEOUS)
TRAP ETRAP POLY (MISCELLANEOUS) IMPLANT
VARIJECT INJECTOR VIN23 (MISCELLANEOUS)
WATER STERILE IRR 250ML POUR (IV SOLUTION) ×2 IMPLANT

## 2019-08-18 NOTE — Anesthesia Procedure Notes (Signed)
Procedure Name: General with mask airway Date/Time: 08/18/2019 8:52 AM Performed by: Jeannene Patella, CRNA Pre-anesthesia Checklist: Timeout performed, Patient being monitored, Suction available, Emergency Drugs available and Patient identified Patient Re-evaluated:Patient Re-evaluated prior to induction Oxygen Delivery Method: Nasal cannula

## 2019-08-18 NOTE — Anesthesia Postprocedure Evaluation (Signed)
Anesthesia Post Note  Patient: Jennifer Hanna  Procedure(s) Performed: COLONOSCOPY WITH PROPOFOL (N/A )     Patient location during evaluation: PACU Anesthesia Type: General Level of consciousness: awake and alert Pain management: pain level controlled Vital Signs Assessment: post-procedure vital signs reviewed and stable Respiratory status: spontaneous breathing, nonlabored ventilation, respiratory function stable and patient connected to nasal cannula oxygen Cardiovascular status: blood pressure returned to baseline and stable Postop Assessment: no apparent nausea or vomiting Anesthetic complications: no    Abdulhadi Stopa A  Zoee Heeney

## 2019-08-18 NOTE — Op Note (Signed)
[facility] Patient Name: Jennifer Hanna Procedure Date: 08/18/2019 8:43 AM MRN: [id number] Account #: [id number] Date of Birth: Jan 07, 1986 Admit Type: Outpatient Age: 34 Room: [facility] OR ROOM 01 Gender: Female Note Status: Finalized Procedure:             Colonoscopy Indications:           Rectal bleeding Providers:             Lin Landsman MD, MD Referring MD:          Dionne Bucy. Bacigalupo (Referring MD) Medicines:             Monitored Anesthesia Care Complications:         No immediate complications. Estimated blood loss: None. Procedure:             Pre-Anesthesia Assessment:                        - Prior to the procedure, a History and Physical was                         performed, and patient medications and allergies were                         reviewed. The patient is competent. The risks and                         benefits of the procedure and the sedation options and                         risks were discussed with the patient. All questions                         were answered and informed consent was obtained.                         Patient identification and proposed procedure were                         verified by the physician, the nurse, the                         anesthesiologist, the anesthetist and the technician                         in the pre-procedure area in the procedure room in the                         endoscopy suite. Mental Status Examination: alert and                         oriented. Airway Examination: normal oropharyngeal                         airway and neck mobility. Respiratory Examination:                         clear to auscultation. CV Examination: normal.  Prophylactic Antibiotics: The patient does not require                         prophylactic antibiotics. Prior Anticoagulants: The                         patient has taken no previous anticoagulant or               antiplatelet agents. ASA Grade Assessment: I - A                         normal, healthy patient. After reviewing the risks and                         benefits, the patient was deemed in satisfactory                         condition to undergo the procedure. The anesthesia                         plan was to use monitored anesthesia care (MAC).                         Immediately prior to administration of medications,                         the patient was re-assessed for adequacy to receive                         sedatives. The heart rate, respiratory rate, oxygen                         saturations, blood pressure, adequacy of pulmonary                         ventilation, and response to care were monitored                         throughout the procedure. The physical status of the                         patient was re-assessed after the procedure.                        After obtaining informed consent, the colonoscope was                         passed under direct vision. Throughout the procedure,                         the patient's blood pressure, pulse, and oxygen                         saturations were monitored continuously. The was                         introduced through the anus and advanced to the the  cecum, identified by appendiceal orifice and ileocecal                         valve. The colonoscopy was performed without                         difficulty. The patient tolerated the procedure well.                         The quality of the bowel preparation was evaluated                         using the BBPS [facility] Bowel Preparation Scale) with                         scores of: Right Colon = 3, Transverse Colon = 3 and                         Left Colon = 3 (entire mucosa seen well with no                         residual staining, small fragments of stool or opaque                         liquid). The total BBPS score equals  9. Findings:      The perianal and digital rectal examinations were normal. Pertinent       negatives include normal sphincter tone and no palpable rectal lesions.      The entire examined colon appeared normal.      The retroflexed view of the distal rectum and anal verge was normal and       showed no anal or rectal abnormalities. Impression:            - The entire examined colon is normal.                        - The distal rectum and anal verge are normal on                         retroflexion view.                        - No specimens collected.                        - Rectal bleedng likely from external hemorrhoids Recommendation:        - Discharge patient to home (with escort).                        - Resume regular diet today.                        - Continue present medications. Procedure Code(s):     --- Professional ---                        [phone], Colonoscopy, flexible; diagnostic, including  collection of specimen(s) by brushing or washing, when                         performed (separate procedure) Diagnosis Code(s):     --- Professional ---                        K62.5, Hemorrhage of anus and rectum CPT copyright 2019 American Medical Association. All rights reserved. The codes documented in this report are preliminary and upon coder review may  be revised to meet current compliance requirements. Dr. Ulyess Mort Lin Landsman MD, MD 08/18/2019 9:07:32 AM This report has been signed electronically. Number of Addenda: 0 Note Initiated On: 08/18/2019 8:43 AM Scope Withdrawal Time: 0 hours 7 minutes 21 seconds  Total Procedure Duration: 0 hours 14 minutes 22 seconds  Estimated Blood Loss:  Estimated blood loss: none.      [facility]

## 2019-08-18 NOTE — H&P (Signed)
Cephas Darby, MD [address]  [place]  [place], [place] [address]  Main: [phone]  Fax: [phone] Pager: [phone]  Primary Care Physician:  Virginia Crews, MD Primary Gastroenterologist:  Dr. Cephas Darby  Pre-Procedure History & Physical: HPI:  Jennifer Hanna is a 34 y.o. female is here for an colonoscopy.   Past Medical History:  Diagnosis Date  . COVID-19    Tested (+) in 05/2019.  Had (-) test right after.  . Tremors of nervous system     Past Surgical History:  Procedure Laterality Date  . KNEE ARTHROSCOPY W/ ACL RECONSTRUCTION Left 2007    Prior to Admission medications   Medication Sig Start Date End Date Taking? Authorizing Provider  Ascorbic Acid (VITAMIN C PO) Take by mouth.   Yes [provider]  Cholecalciferol (VITAMIN D3 PO) Take by mouth.   Yes [provider]  Multiple Vitamin (MULTIVITAMIN) tablet Take 1 tablet by mouth daily.   Yes [provider]    Allergies as of 06/09/2019  . (No Known Allergies)    Family History  Problem Relation Age of Onset  . Hypertension Father   . Breast cancer Mother 23  . Hypertension Sister   . Healthy Brother   . Colon cancer Neg Hx   . Ovarian cancer Neg Hx     Social History   Socioeconomic History  . Marital status: Single    Spouse name: Not on file  . Number of children: 0  . Years of education: Not on file  . Highest education level: Not on file  Occupational History  . Occupation: Biochemist, clinical  Tobacco Use  . Smoking status: Never Smoker  . Smokeless tobacco: Never Used  Substance and Sexual Activity  . Alcohol use: Yes    Alcohol/week: 3.0 standard drinks    Types: 3 Glasses of wine per week    Comment: occasional   . Drug use: No  . Sexual activity: Yes    Partners: Male    Birth control/protection: Condom  Other Topics Concern  . Not on file  Social History Narrative  . Not on file   Social Determinants of Health    Financial Resource Strain:   . Difficulty of Paying Living Expenses:   Food Insecurity:   . Worried About Charity fundraiser in the Last Year:   . Arboriculturist in the Last Year:   Transportation Needs:   . Film/video editor (Medical):   Marland Kitchen Lack of Transportation (Non-Medical):   Physical Activity:   . Days of Exercise per Week:   . Minutes of Exercise per Session:   Stress:   . Feeling of Stress :   Social Connections:   . Frequency of Communication with Friends and Family:   . Frequency of Social Gatherings with Friends and Family:   . Attends Religious Services:   . Active Member of Clubs or Organizations:   . Attends Archivist Meetings:   Marland Kitchen Marital Status:   Intimate Partner Violence:   . Fear of Current or Ex-Partner:   . Emotionally Abused:   Marland Kitchen Physically Abused:   . Sexually Abused:     Review of Systems: See HPI, otherwise negative ROS  Physical Exam: Ht 5\' 8"  (1.727 m)   Wt 65.8 kg   LMP 07/22/2019 (Exact Date)   BMI 22.05 kg/m  General:   Alert,  pleasant and cooperative in NAD Head:  Normocephalic and atraumatic. Neck:  Supple; no masses or thyromegaly. Lungs:  Clear throughout to auscultation.    Heart:  Regular rate and rhythm. Abdomen:  Soft, nontender and nondistended. Normal bowel sounds, without guarding, and without rebound.   Neurologic:  Alert and  oriented x4;  grossly normal neurologically.  Impression/Plan: Jennifer Hanna is here for an colonoscopy to be performed for rectal bleeding  Risks, benefits, limitations, and alternatives regarding  colonoscopy have been reviewed with the patient.  Questions have been answered.  All parties agreeable.   Sherri Sear, MD  08/18/2019, 8:07 AM

## 2019-08-18 NOTE — Transfer of Care (Signed)
Immediate Anesthesia Transfer of Care Note  Patient: Jennifer Hanna  Procedure(s) Performed: COLONOSCOPY WITH PROPOFOL (N/A )  Patient Location: PACU  Anesthesia Type: General  Level of Consciousness: awake, alert  and patient cooperative  Airway and Oxygen Therapy: Patient Spontanous Breathing and Patient connected to supplemental oxygen  Post-op Assessment: Post-op Vital signs reviewed, Patient's Cardiovascular Status Stable, Respiratory Function Stable, Patent Airway and No signs of Nausea or vomiting  Post-op Vital Signs: Reviewed and stable  Complications: No apparent anesthesia complications

## 2019-08-19 ENCOUNTER — Encounter: Payer: Self-pay | Admitting: *Deleted

## 2019-08-19 ENCOUNTER — Encounter: Payer: Self-pay | Admitting: Family Medicine

## 2019-08-21 ENCOUNTER — Ambulatory Visit (INDEPENDENT_AMBULATORY_CARE_PROVIDER_SITE_OTHER): Payer: BC Managed Care – PPO | Admitting: Family Medicine

## 2019-08-21 ENCOUNTER — Other Ambulatory Visit: Payer: Self-pay

## 2019-08-21 ENCOUNTER — Encounter: Payer: Self-pay | Admitting: Family Medicine

## 2019-08-21 VITALS — BP 101/64 | HR 70 | Temp 97.1°F | Ht 68.0 in | Wt 144.0 lb

## 2019-08-21 DIAGNOSIS — Z0000 Encounter for general adult medical examination without abnormal findings: Secondary | ICD-10-CM | POA: Diagnosis not present

## 2019-08-21 NOTE — Progress Notes (Signed)
Patient: Jennifer Hanna, Female    DOB: 1985/08/12, 34 y.o.   MRN: [id number] Visit Date: 08/21/2019  Today's Provider: Lavon Paganini, MD   Chief Complaint  Patient presents with  . Annual Exam   Subjective:     Annual physical exam Jennifer Hanna is a 34 y.o. female who presents today for health maintenance and complete physical. She feels well. She reports exercising regularly. She reports she is sleeping well.  -----------------------------------------------------------------   Review of Systems  Constitutional: Negative.   HENT: Negative.   Eyes: Negative.   Respiratory: Negative.   Cardiovascular: Negative.   Gastrointestinal: Negative.   Endocrine: Negative.   Genitourinary: Negative.   Musculoskeletal: Positive for back pain. Negative for arthralgias, gait problem, joint swelling, myalgias, neck pain and neck stiffness.  Skin: Negative.   Allergic/Immunologic: Negative.   Neurological: Negative.   Hematological: Negative.   Psychiatric/Behavioral: Negative.     Social History      She  reports that she has never smoked. She has never used smokeless tobacco. She reports current alcohol use of about 3.0 standard drinks of alcohol per week. She reports that she does not use drugs.       Social History   Socioeconomic History  . Marital status: Single    Spouse name: Not on file  . Number of children: 0  . Years of education: Not on file  . Highest education level: Not on file  Occupational History  . Occupation: Biochemist, clinical  Tobacco Use  . Smoking status: Never Smoker  . Smokeless tobacco: Never Used  Substance and Sexual Activity  . Alcohol use: Yes    Alcohol/week: 3.0 standard drinks    Types: 3 Glasses of wine per week    Comment: occasional   . Drug use: No  . Sexual activity: Yes    Partners: Male    Birth control/protection: Condom  Other Topics Concern  . Not on file  Social History Narrative  . Not on file   Social  Determinants of Health   Financial Resource Strain:   . Difficulty of Paying Living Expenses:   Food Insecurity:   . Worried About Charity fundraiser in the Last Year:   . Arboriculturist in the Last Year:   Transportation Needs:   . Film/video editor (Medical):   Marland Kitchen Lack of Transportation (Non-Medical):   Physical Activity:   . Days of Exercise per Week:   . Minutes of Exercise per Session:   Stress:   . Feeling of Stress :   Social Connections:   . Frequency of Communication with Friends and Family:   . Frequency of Social Gatherings with Friends and Family:   . Attends Religious Services:   . Active Member of Clubs or Organizations:   . Attends Archivist Meetings:   Marland Kitchen Marital Status:     Past Medical History:  Diagnosis Date  . COVID-19    Tested (+) in 05/2019.  Had (-) test right after.  . Tremors of nervous system      Patient Active Problem List   Diagnosis Date Noted  . Rectal bleeding   . Subserous leiomyoma of uterus 08/13/2019  . Pelvic pain 08/13/2019  . GERD (gastroesophageal reflux disease) 09/03/2018  . Globus sensation 09/03/2018  . Leukopenia 07/13/2018  . Tremor 05/02/2018  . Eustachian tube dysfunction 05/02/2018  . Nodule of anterior chest wall 05/02/2018  . Family history of breast cancer in  mother 01/06/2018    Past Surgical History:  Procedure Laterality Date  . COLONOSCOPY WITH PROPOFOL N/A 08/18/2019   Procedure: COLONOSCOPY WITH PROPOFOL;  Surgeon: Lin Landsman, MD;  Location: [facility];  Service: Endoscopy;  Laterality: N/A;  . KNEE ARTHROSCOPY W/ ACL RECONSTRUCTION Left 2007    Family History        Family Status  Relation Name Status  . Father  Alive  . Mother  Alive  . Sister  Alive  . Brother  Alive  . MGM  Deceased  . MGF  Deceased  . PGM  Deceased  . PGF  Deceased  . Neg Hx  (Not Specified)        Her family history includes Breast cancer (age of onset: 46) in her mother; Healthy in her  brother; Hypertension in her father and sister. There is no history of Colon cancer or Ovarian cancer.      No Known Allergies   Current Outpatient Medications:  .  Ascorbic Acid (VITAMIN C PO), Take by mouth., Disp: , Rfl:  .  Cholecalciferol (VITAMIN D3 PO), Take by mouth., Disp: , Rfl:  .  Multiple Vitamin (MULTIVITAMIN) tablet, Take 1 tablet by mouth daily., Disp: , Rfl:  .  NON FORMULARY, Lactoferrin, Disp: , Rfl:    Patient Care Team: Virginia Crews, MD as PCP - General (Family Medicine)    Objective:    Vitals: BP 101/64 (BP Location: Left Arm, Patient Position: Sitting, Cuff Size: Normal)   Pulse 70   Temp (!) 97.1 F (36.2 C) (Temporal)   Ht 5\' 8"  (1.727 m)   Wt 144 lb (65.3 kg)   LMP 07/22/2019 (Exact Date) Comment: preg test neg  BMI 21.90 kg/m    Vitals:   08/21/19 1013  BP: 101/64  Pulse: 70  Temp: (!) 97.1 F (36.2 C)  TempSrc: Temporal  Weight: 144 lb (65.3 kg)  Height: 5\' 8"  (1.727 m)     Physical Exam Vitals reviewed.  Constitutional:      General: She is not in acute distress.    Appearance: Normal appearance. She is well-developed. She is not diaphoretic.  HENT:     Head: Normocephalic and atraumatic.     Right Ear: Tympanic membrane, ear canal and external ear normal.     Left Ear: Tympanic membrane, ear canal and external ear normal.  Eyes:     General: No scleral icterus.    Conjunctiva/sclera: Conjunctivae normal.     Pupils: Pupils are equal, round, and reactive to light.  Neck:     Thyroid: No thyromegaly.  Cardiovascular:     Rate and Rhythm: Normal rate and regular rhythm.     Heart sounds: Normal heart sounds. No murmur.  Pulmonary:     Effort: Pulmonary effort is normal. No respiratory distress.     Breath sounds: Normal breath sounds. No wheezing or rales.  Abdominal:     General: There is no distension.     Palpations: Abdomen is soft.     Tenderness: There is no abdominal tenderness.  Musculoskeletal:         General: No deformity.     Cervical back: Neck supple.     Right lower leg: No edema.     Left lower leg: No edema.  Lymphadenopathy:     Cervical: No cervical adenopathy.  Skin:    General: Skin is warm and dry.     Findings: No rash.  Neurological:  Mental Status: She is alert and oriented to person, place, and time. Mental status is at baseline.  Psychiatric:        Mood and Affect: Mood normal.        Behavior: Behavior normal.        Thought Content: Thought content normal.      Depression Screen PHQ 2/9 Scores 05/02/2018  PHQ - 2 Score 0  PHQ- 9 Score 0       Assessment & Plan:     Routine Health Maintenance and Physical Exam  Exercise Activities and Dietary recommendations Goals   None     Immunization History  Administered Date(s) Administered  . Tdap 05/02/2018    Health Maintenance  Topic Date Due  . INFLUENZA VACCINE  12/20/2019  . PAP SMEAR-Modifier  05/03/2020  . TETANUS/TDAP  05/02/2028  . HIV Screening  Completed     Discussed health benefits of physical activity, and encouraged her to engage in regular exercise appropriate for her age and condition.    -------------------------------------------------------------------- Problem List Items Addressed This Visit    None    Visit Diagnoses    Encounter for annual physical exam    -  Primary   Relevant Orders   Comprehensive metabolic panel   TSH   Lipid panel       Return in about 1 year (around 08/20/2020) for CPE.   The entirety of the information documented in the History of Present Illness, Review of Systems and Physical Exam were personally obtained by me. Portions of this information were initially documented by Ashley Royalty, CMA and reviewed by me for thoroughness and accuracy.    Donalyn Schneeberger, Dionne Bucy, MD MPH [facility]

## 2019-08-21 NOTE — Patient Instructions (Signed)
Preventive Care 21-35 Years Old, Female Preventive care refers to visits with your health care provider and lifestyle choices that can promote health and wellness. This includes:  A yearly physical exam. This may also be called an annual well check.  Regular dental visits and eye exams.  Immunizations.  Screening for certain conditions.  Healthy lifestyle choices, such as eating a healthy diet, getting regular exercise, not using drugs or products that contain nicotine and tobacco, and limiting alcohol use. What can I expect for my preventive care visit? Physical exam Your health care provider will check your:  Height and weight. This may be used to calculate body mass index (BMI), which tells if you are at a healthy weight.  Heart rate and blood pressure.  Skin for abnormal spots. Counseling Your health care provider may ask you questions about your:  Alcohol, tobacco, and drug use.  Emotional well-being.  Home and relationship well-being.  Sexual activity.  Eating habits.  Work and work environment.  Method of birth control.  Menstrual cycle.  Pregnancy history. What immunizations do I need?  Influenza (flu) vaccine  This is recommended every year. Tetanus, diphtheria, and pertussis (Tdap) vaccine  You may need a Td booster every 10 years. Varicella (chickenpox) vaccine  You may need this if you have not been vaccinated. Human papillomavirus (HPV) vaccine  If recommended by your health care provider, you may need three doses over 6 months. Measles, mumps, and rubella (MMR) vaccine  You may need at least one dose of MMR. You may also need a second dose. Meningococcal conjugate (MenACWY) vaccine  One dose is recommended if you are age 19-21 years and a first-year college student living in a residence hall, or if you have one of several medical conditions. You may also need additional booster doses. Pneumococcal conjugate (PCV13) vaccine  You may need  this if you have certain conditions and were not previously vaccinated. Pneumococcal polysaccharide (PPSV23) vaccine  You may need one or two doses if you smoke cigarettes or if you have certain conditions. Hepatitis A vaccine  You may need this if you have certain conditions or if you travel or work in places where you may be exposed to hepatitis A. Hepatitis B vaccine  You may need this if you have certain conditions or if you travel or work in places where you may be exposed to hepatitis B. Haemophilus influenzae type b (Hib) vaccine  You may need this if you have certain conditions. You may receive vaccines as individual doses or as more than one vaccine together in one shot (combination vaccines). Talk with your health care provider about the risks and benefits of combination vaccines. What tests do I need?  Blood tests  Lipid and cholesterol levels. These may be checked every 5 years starting at age 20.  Hepatitis C test.  Hepatitis B test. Screening  Diabetes screening. This is done by checking your blood sugar (glucose) after you have not eaten for a while (fasting).  Sexually transmitted disease (STD) testing.  BRCA-related cancer screening. This may be done if you have a family history of breast, ovarian, tubal, or peritoneal cancers.  Pelvic exam and Pap test. This may be done every 3 years starting at age 21. Starting at age 30, this may be done every 5 years if you have a Pap test in combination with an HPV test. Talk with your health care provider about your test results, treatment options, and if necessary, the need for more tests.   Follow these instructions at home: Eating and drinking   Eat a diet that includes fresh fruits and vegetables, whole grains, lean protein, and low-fat dairy.  Take vitamin and mineral supplements as recommended by your health care provider.  Do not drink alcohol if: ? Your health care provider tells you not to drink. ? You are  pregnant, may be pregnant, or are planning to become pregnant.  If you drink alcohol: ? Limit how much you have to 0-1 drink a day. ? Be aware of how much alcohol is in your drink. In the [place] one drink equals one 12 oz bottle of beer (355 mL), one 5 oz glass of wine (148 mL), or one 1 oz glass of hard liquor (44 mL). Lifestyle  Take daily care of your teeth and gums.  Stay active. Exercise for at least 30 minutes on 5 or more days each week.  Do not use any products that contain nicotine or tobacco, such as cigarettes, e-cigarettes, and chewing tobacco. If you need help quitting, ask your health care provider.  If you are sexually active, practice safe sex. Use a condom or other form of birth control (contraception) in order to prevent pregnancy and STIs (sexually transmitted infections). If you plan to become pregnant, see your health care provider for a preconception visit. What's next?  Visit your health care provider once a year for a well check visit.  Ask your health care provider how often you should have your eyes and teeth checked.  Stay up to date on all vaccines. This information is not intended to replace advice given to you by your health care provider. Make sure you discuss any questions you have with your health care provider. Document Revised: 01/16/2018 Document Reviewed: 01/16/2018 Elsevier Patient Education  2020 [employer].

## 2019-08-24 DIAGNOSIS — E785 Hyperlipidemia, unspecified: Secondary | ICD-10-CM | POA: Diagnosis not present

## 2019-08-24 DIAGNOSIS — Z0000 Encounter for general adult medical examination without abnormal findings: Secondary | ICD-10-CM | POA: Diagnosis not present

## 2019-08-24 DIAGNOSIS — K219 Gastro-esophageal reflux disease without esophagitis: Secondary | ICD-10-CM | POA: Diagnosis not present

## 2019-08-24 DIAGNOSIS — D72819 Decreased white blood cell count, unspecified: Secondary | ICD-10-CM | POA: Diagnosis not present

## 2019-08-25 LAB — LIPID PANEL
Chol/HDL Ratio: 3.5 ratio (ref 0.0–4.4)
Cholesterol, Total: 197 mg/dL (ref 100–199)
HDL: 56 mg/dL (ref >39)
LDL Chol Calc (NIH): 131 mg/dL — ABNORMAL HIGH (ref 0–99)
Triglycerides: 56 mg/dL (ref 0–149)
VLDL Cholesterol Cal: 10 mg/dL (ref 5–40)

## 2019-08-25 LAB — COMPREHENSIVE METABOLIC PANEL
ALT: 13 IU/L (ref 0–32)
AST: 20 IU/L (ref 0–40)
Albumin/Globulin Ratio: 1.5 (ref 1.2–2.2)
Albumin: 4.3 g/dL (ref 3.8–4.8)
Alkaline Phosphatase: 44 IU/L (ref 39–117)
BUN/Creatinine Ratio: 11 (ref 9–23)
BUN: 12 mg/dL (ref 6–20)
Bilirubin Total: 0.4 mg/dL (ref 0.0–1.2)
CO2: 21 mmol/L (ref 20–29)
Calcium: 9.2 mg/dL (ref 8.7–10.2)
Chloride: 105 mmol/L (ref 96–106)
Creatinine, Ser: 1.08 mg/dL — ABNORMAL HIGH (ref 0.57–1.00)
GFR calc Af Amer: 77 mL/min/{1.73_m2} (ref >59)
GFR calc non Af Amer: 67 mL/min/{1.73_m2} (ref >59)
Globulin, Total: 2.9 g/dL (ref 1.5–4.5)
Glucose: 91 mg/dL (ref 65–99)
Potassium: 4 mmol/L (ref 3.5–5.2)
Sodium: 139 mmol/L (ref 134–144)
Total Protein: 7.2 g/dL (ref 6.0–8.5)

## 2019-08-25 LAB — TSH: TSH: 3.55 u[IU]/mL (ref 0.450–4.500)

## 2019-09-01 ENCOUNTER — Telehealth: Payer: Self-pay

## 2019-09-01 ENCOUNTER — Encounter: Payer: Self-pay | Admitting: Family Medicine

## 2019-09-01 DIAGNOSIS — R7989 Other specified abnormal findings of blood chemistry: Secondary | ICD-10-CM

## 2019-09-01 NOTE — Telephone Encounter (Signed)
LMTCB 09/01/2019.  PEC Please advise pt of lab results below.  Also Dr B wants her to recheck her labs in one month.     Thanks,   -Mickel Baas

## 2019-09-01 NOTE — Telephone Encounter (Signed)
-----   Message from Virginia Crews, MD sent at 09/01/2019  2:07 PM EDT ----- Normal labs, except kidney function was slightly decreased from baseline.  This could be related to any dehydration or NSAID use.  Recommend hydrating well and rechecking

## 2019-09-02 NOTE — Addendum Note (Signed)
Addended by: Althea Charon D on: 09/02/2019 10:24 AM   Modules accepted: Orders

## 2019-09-02 NOTE — Telephone Encounter (Signed)
Yep. Repeat BMP.  Can just leave up front. Dx: Elevated creatinine

## 2019-09-02 NOTE — Telephone Encounter (Signed)
Phone call to pt.  Advised of result note by Dr. Jacinto Reap. And recommendation to hydrate well, and recheck kidney function.  Advised pt. that caffeinated beverages can be mildly dehydrating, and to focus on drinking water and decaffeinated beverages.  Pt. Advised that Dr. B. would like her to repeat labs in one month.  Pt verb. Understanding.

## 2019-09-02 NOTE — Telephone Encounter (Signed)
I do not see any labs ordered.  I can place them if you let me know if you want all repeated or just the met c

## 2019-09-30 DIAGNOSIS — R7989 Other specified abnormal findings of blood chemistry: Secondary | ICD-10-CM | POA: Diagnosis not present

## 2019-10-01 ENCOUNTER — Telehealth: Payer: Self-pay

## 2019-10-01 LAB — BASIC METABOLIC PANEL
BUN/Creatinine Ratio: 13 (ref 9–23)
BUN: 13 mg/dL (ref 6–20)
CO2: 22 mmol/L (ref 20–29)
Calcium: 9.4 mg/dL (ref 8.7–10.2)
Chloride: 101 mmol/L (ref 96–106)
Creatinine, Ser: 0.97 mg/dL (ref 0.57–1.00)
GFR calc Af Amer: 88 mL/min/{1.73_m2} (ref >59)
GFR calc non Af Amer: 76 mL/min/{1.73_m2} (ref >59)
Glucose: 83 mg/dL (ref 65–99)
Potassium: 4 mmol/L (ref 3.5–5.2)
Sodium: 138 mmol/L (ref 134–144)

## 2019-10-01 NOTE — Telephone Encounter (Signed)
Left message advising pt.  (Per DPR)  Thanks,   -Henrick Mcgue  

## 2019-10-01 NOTE — Telephone Encounter (Signed)
-----   Message from Virginia Crews, MD sent at 10/01/2019  8:14 AM EDT ----- Normal labs

## 2020-02-18 ENCOUNTER — Encounter: Payer: BC Managed Care – PPO | Admitting: Certified Nurse Midwife

## 2020-02-18 ENCOUNTER — Other Ambulatory Visit: Payer: BC Managed Care – PPO

## 2020-07-28 ENCOUNTER — Encounter: Payer: BC Managed Care – PPO | Admitting: Certified Nurse Midwife

## 2020-08-04 ENCOUNTER — Encounter: Payer: Self-pay | Admitting: Certified Nurse Midwife

## 2020-08-25 ENCOUNTER — Encounter: Payer: Self-pay | Admitting: Certified Nurse Midwife

## 2020-08-26 ENCOUNTER — Encounter: Payer: BC Managed Care – PPO | Admitting: Family Medicine

## 2021-02-10 ENCOUNTER — Encounter: Payer: Self-pay | Admitting: General Surgery
